# Patient Record
Sex: Female | Born: 1995 | Race: Black or African American | Hispanic: No | Marital: Single | Smoking: Never smoker
Health system: Southern US, Community
[De-identification: ages and names within clinical notes are randomized; demographics above are authoritative.]

## PROBLEM LIST (undated history)

## (undated) DIAGNOSIS — J45909 Unspecified asthma, uncomplicated: Secondary | ICD-10-CM

## (undated) DIAGNOSIS — M542 Cervicalgia: Secondary | ICD-10-CM

## (undated) DIAGNOSIS — R51 Headache: Secondary | ICD-10-CM

## (undated) DIAGNOSIS — M069 Rheumatoid arthritis, unspecified: Secondary | ICD-10-CM

## (undated) DIAGNOSIS — S060X9A Concussion with loss of consciousness of unspecified duration, initial encounter: Secondary | ICD-10-CM

## (undated) DIAGNOSIS — R519 Headache, unspecified: Secondary | ICD-10-CM

## (undated) DIAGNOSIS — N390 Urinary tract infection, site not specified: Secondary | ICD-10-CM

## (undated) DIAGNOSIS — M549 Dorsalgia, unspecified: Secondary | ICD-10-CM

## (undated) HISTORY — DX: Concussion with loss of consciousness of unspecified duration, initial encounter: S06.0X9A

## (undated) HISTORY — DX: Rheumatoid arthritis, unspecified: M06.9

## (undated) HISTORY — DX: Headache, unspecified: R51.9

## (undated) HISTORY — DX: Dorsalgia, unspecified: M54.9

## (undated) HISTORY — DX: Headache: R51

## (undated) HISTORY — DX: Cervicalgia: M54.2

## (undated) HISTORY — PX: BREAST BIOPSY: SHX20

---

## 2016-10-12 NOTE — Progress Notes (Signed)
Office Visit Note  Patient: Leah Wise             Date of Birth: 12-05-1995           MRN: [id number]             PCP: Kennyth Arnold, FNP Referring: Kennyth Arnold, FNP Visit Date: 10/17/2016 Occupation: '@GUAROCC'$ @    Subjective:  Pain of the Left Wrist; Pain of the Right Wrist; Pain of the Left Elbow; Pain of the Right Elbow; Pain of the Right Knee; Pain of the Left Knee; Pain of the Left Foot; Pain of the Right Foot; Pain of the Left Ankle; Pain of the Right Ankle; and New Patient (Initial Visit)   History of Present Illness: Leah Wise is a 21 y.o. female seen in consultation per request of her PCP. According to patient her symptoms started about 2 years ago with generalized arthralgias. She states initially started with the stiffness in knee joint popping and gradually the arthralgias, worse. And now she's been having increased pain in multiple joints which she describes as her elbows, wrist joints, hands, knee joints, ankle joints and feet. She reports hand swelling when exposed to the cold weather. She also notices that her hands turn red and she is exposed to the cold. She denies any neck or lower back pain. Patient complains of hives when exposed to the cold weather. She states the swelling usually happens when she is exposed to cold weather as well. He also reports some pictures on her eye phone today which shows clear swelling in her hands.  Activities of Daily Living:  Patient reports morning stiffness for 30 minutes.   Patient Reports nocturnal pain.  Difficulty dressing/grooming: Denies Difficulty climbing stairs: Reports Difficulty getting out of chair: Denies Difficulty using hands for taps, buttons, cutlery, and/or writing: Reports   Review of Systems  Constitutional: Positive for fatigue. Negative for night sweats, weight gain, weight loss and weakness.  HENT: Negative for mouth sores, trouble swallowing, trouble swallowing, mouth dryness and nose dryness.     Eyes: Negative for pain, redness, visual disturbance and dryness.  Respiratory: Negative for cough, shortness of breath and difficulty breathing.   Cardiovascular: Negative for chest pain, palpitations, hypertension, irregular heartbeat and swelling in legs/feet.  Gastrointestinal: Negative for blood in stool, constipation and diarrhea.  Endocrine: Negative for increased urination.  Genitourinary: Negative for vaginal dryness.  Musculoskeletal: Positive for arthralgias, joint pain, joint swelling, myalgias, morning stiffness and myalgias. Negative for muscle weakness and muscle tenderness.  Skin: Positive for color change. Negative for rash, hair loss, skin tightness, ulcers and sensitivity to sunlight.  Allergic/Immunologic: Negative for susceptible to infections.  Neurological: Negative for dizziness, memory loss and night sweats.  Hematological: Negative for swollen glands.  Psychiatric/Behavioral: Negative for depressed mood and sleep disturbance. The patient is not nervous/anxious.     PMFS History:  There are no active problems to display for this patient.   Past Medical History:  Diagnosis Date  . Asthma     No family history on file. History reviewed. No pertinent surgical history. Social History   Social History Narrative  . No narrative on file     Objective: Vital Signs: BP 106/66   Pulse 82   Resp 14   Ht '5\' 3"'$  (1.6 m)   Wt 118 lb (53.5 kg)   LMP 10/15/2016 (Exact Date)   BMI 20.90 kg/m    Physical Exam  Constitutional: She is oriented to person, place, and  time. She appears well-developed and well-nourished.  HENT:  Head: Normocephalic and atraumatic.  Eyes: Conjunctivae and EOM are normal.  Neck: Normal range of motion.  Cardiovascular: Normal rate, regular rhythm, normal heart sounds and intact distal pulses.   Pulmonary/Chest: Effort normal and breath sounds normal.  Abdominal: Soft. Bowel sounds are normal.  Lymphadenopathy:    She has no cervical  adenopathy.  Neurological: She is alert and oriented to person, place, and time.  Skin: Skin is warm and dry. Capillary refill takes less than 2 seconds.  Psychiatric: She has a normal mood and affect. Her behavior is normal.  Nursing note and vitals reviewed.    Musculoskeletal Exam: C-spine and thoracic lumbar spine good range of motion no SI joint tenderness. Shoulder joints although joints wrist joint MCPs PIPs DIPs with good range of motion with no synovitis. Hip joints knee joints ankles MTPs PIPs DIPs are good range of motion with no synovitis.  CDAI Exam: CDAI Homunculus Exam:   Joint Counts:  CDAI Tender Joint count: 0 CDAI Swollen Joint count: 0     Investigation: Findings:  01/22/2015 CBC WBC 11.880% granulocytes hemoglobin 12.5 platelets 240, CMP normal, ESR 12, TSH normal, rheumatoid factor less than 10, ANA negative    Imaging: Xr Foot 2 Views Left  Result Date: 10/17/2016 No MTP PIP/DIP's narrowing was noted. No calcaneal spur was noted. No erosive changes were noted. Impression: Normal x-ray of the foot  Xr Foot 2 Views Right  Result Date: 10/17/2016 No MTP PIP/DIP's narrowing was noted. No calcaneal spur was noted. No erosive changes were noted. Impression: Normal x-ray of the foot  Xr Hand 2 View Left  Result Date: 10/17/2016 The joint space narrowing noted in MCPs PIPs or intercarpal joints. No erosive changes were noted. Impression: Normal x-ray of hand  Xr Hand 2 View Right  Result Date: 10/17/2016 The joint space narrowing noted in MCPs PIPs or intercarpal joints. No erosive changes were noted. Impression: Normal x-ray of hand  Xr Knee 3 View Left  Result Date: 10/17/2016 No narrowing of medial or lateral compartment was noted. No patellofemoral narrowing was noted. No chondrocalcinosis was noted. Impression: Normal knee x-ray.  Xr Knee 3 View Right  Result Date: 10/17/2016 No narrowing of medial or lateral compartment was noted. No patellofemoral  narrowing was noted. No chondrocalcinosis was noted. Impression: Normal knee x-ray.   Speciality Comments: No specialty comments available.    Procedures:  No procedures performed Allergies: Patient has no known allergies.   Assessment / Plan:     Visit Diagnoses: Polyarthralgia -patient complains of frequent episodes of joint swelling especially when she is exposed to cold weather. She also got some pictures on eye phone showing swelling in her hands. She had no synovitis on examination today. Plan: CBC with Differential/Platelet, COMPLETE METABOLIC PANEL WITH GFR, Urinalysis, Routine w reflex microscopic, Sedimentation rate  Pain in both hands - Plan: XR Hand 2 View Right, XR Hand 2 View Left, x-rays were within normal limits today ANA, Rheumatoid factor, Cyclic citrul peptide antibody, IgG, 14-3-3 eta Protein. I will also schedule ultrasound of her bilateral hands to look for synovitis.  Chronic pain of both knees -she used to dance and had been dancing some is still. She complains of knee joint popping and discomfort. Plan: XR KNEE 3 VIEW RIGHT, XR KNEE 3 VIEW LEFT. Her x-rays were normal  Pain in both feet -she gives history of frequent problems with plantar fasciitis and intermittent feet swelling. Plan: XR Foot  2 Views Right, XR Foot 2 Views Left, her x-rays were normal today HLA-B27 antigen, Angiotensin converting enzyme  Hives -she complains of hives when exposed to cold weather. Plan: C1 Esterase Inhibitor  Myalgia - Plan: CK    Orders: Orders Placed This Encounter  Procedures  . XR Hand 2 View Right  . XR Hand 2 View Left  . XR Foot 2 Views Right  . XR Foot 2 Views Left  . XR KNEE 3 VIEW RIGHT  . XR KNEE 3 VIEW LEFT  . CBC with Differential/Platelet  . COMPLETE METABOLIC PANEL WITH GFR  . Urinalysis, Routine w reflex microscopic  . Sedimentation rate  . CK  . ANA  . Rheumatoid factor  . Cyclic citrul peptide antibody, IgG  . 14-3-3 eta Protein  . HLA-B27 antigen    . Angiotensin converting enzyme  . C1 Esterase Inhibitor   No orders of the defined types were placed in this encounter.   Face-to-face time spent with patient was 45 minutes. 50% of time was spent in counseling and coordination of care.  Follow-Up Instructions: Return for Polyarthralgia.   Bo Merino, MD  Note - This record has been created using Editor, commissioning.  Chart creation errors have been sought, but may not always  have been located. Such creation errors do not reflect on  the standard of medical care.

## 2016-10-15 ENCOUNTER — Emergency Department (HOSPITAL_COMMUNITY)
Admission: EM | Admit: 2016-10-15 | Discharge: 2016-10-15 | Disposition: A | Discharge status: Home / Self Care | Payer: BLUE CROSS/BLUE SHIELD | Attending: Emergency Medicine | Admitting: Emergency Medicine

## 2016-10-15 ENCOUNTER — Encounter (HOSPITAL_COMMUNITY): Payer: Self-pay

## 2016-10-15 DIAGNOSIS — T148XXA Other injury of unspecified body region, initial encounter: Secondary | ICD-10-CM

## 2016-10-15 DIAGNOSIS — J45909 Unspecified asthma, uncomplicated: Secondary | ICD-10-CM | POA: Insufficient documentation

## 2016-10-15 DIAGNOSIS — S39012A Strain of muscle, fascia and tendon of lower back, initial encounter: Secondary | ICD-10-CM | POA: Insufficient documentation

## 2016-10-15 DIAGNOSIS — Y999 Unspecified external cause status: Secondary | ICD-10-CM | POA: Diagnosis not present

## 2016-10-15 DIAGNOSIS — Y92410 Unspecified street and highway as the place of occurrence of the external cause: Secondary | ICD-10-CM | POA: Diagnosis not present

## 2016-10-15 DIAGNOSIS — Y939 Activity, unspecified: Secondary | ICD-10-CM | POA: Insufficient documentation

## 2016-10-15 DIAGNOSIS — S3992XA Unspecified injury of lower back, initial encounter: Secondary | ICD-10-CM | POA: Diagnosis present

## 2016-10-15 DIAGNOSIS — S060XAA Concussion with loss of consciousness status unknown, initial encounter: Secondary | ICD-10-CM

## 2016-10-15 DIAGNOSIS — S060X9A Concussion with loss of consciousness of unspecified duration, initial encounter: Secondary | ICD-10-CM

## 2016-10-15 HISTORY — DX: Concussion with loss of consciousness status unknown, initial encounter: S06.0XAA

## 2016-10-15 HISTORY — DX: Concussion with loss of consciousness of unspecified duration, initial encounter: S06.0X9A

## 2016-10-15 HISTORY — DX: Unspecified asthma, uncomplicated: J45.909

## 2016-10-15 MED ORDER — IBUPROFEN 400 MG PO TABS
600.0000 mg | ORAL_TABLET | Freq: Once | ORAL | Status: AC
  Administered 2016-10-15: 600 mg via ORAL
  Filled 2016-10-15: qty 1

## 2016-10-15 MED ORDER — CYCLOBENZAPRINE HCL 10 MG PO TABS
10.0000 mg | ORAL_TABLET | Freq: Once | ORAL | Status: AC
  Administered 2016-10-15: 10 mg via ORAL
  Filled 2016-10-15: qty 1

## 2016-10-15 MED ORDER — CYCLOBENZAPRINE HCL 10 MG PO TABS: 10.0000 mg | ORAL_TABLET | Freq: Two times a day (BID) | ORAL | 0 refills | Status: AC | PRN
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
## 2016-10-15 NOTE — Discharge Instructions (Signed)
You will be sore for the next few days.   Take Ibuprofen ever 6-8 hours as needed for pain.   Take the Flexeril as prescribed for muscle spasm. This will make you drowsy so do not take it while driving.   Please return to the ED if you have any worsening pain, numbness or weakness of your arms or legs, severe abdominal pain, nausea/vomiting.

## 2016-10-17 ENCOUNTER — Ambulatory Visit (INDEPENDENT_AMBULATORY_CARE_PROVIDER_SITE_OTHER): Payer: Self-pay

## 2016-10-17 ENCOUNTER — Encounter: Payer: Self-pay | Admitting: Rheumatology

## 2016-10-17 ENCOUNTER — Ambulatory Visit (INDEPENDENT_AMBULATORY_CARE_PROVIDER_SITE_OTHER): Payer: BLUE CROSS/BLUE SHIELD | Admitting: Rheumatology

## 2016-10-17 VITALS — BP 106/66 | HR 82 | Resp 14 | Ht 63.0 in | Wt 118.0 lb

## 2016-10-17 DIAGNOSIS — M791 Myalgia, unspecified site: Secondary | ICD-10-CM

## 2016-10-17 DIAGNOSIS — M79642 Pain in left hand: Secondary | ICD-10-CM

## 2016-10-17 DIAGNOSIS — M25561 Pain in right knee: Secondary | ICD-10-CM | POA: Diagnosis not present

## 2016-10-17 DIAGNOSIS — M79641 Pain in right hand: Secondary | ICD-10-CM | POA: Diagnosis not present

## 2016-10-17 DIAGNOSIS — L509 Urticaria, unspecified: Secondary | ICD-10-CM

## 2016-10-17 DIAGNOSIS — M79671 Pain in right foot: Secondary | ICD-10-CM

## 2016-10-17 DIAGNOSIS — G8929 Other chronic pain: Secondary | ICD-10-CM | POA: Diagnosis not present

## 2016-10-17 DIAGNOSIS — M79672 Pain in left foot: Secondary | ICD-10-CM

## 2016-10-17 DIAGNOSIS — M2550 Pain in unspecified joint: Secondary | ICD-10-CM | POA: Diagnosis not present

## 2016-10-17 DIAGNOSIS — M25562 Pain in left knee: Secondary | ICD-10-CM | POA: Diagnosis not present

## 2016-10-17 LAB — CBC WITH DIFFERENTIAL/PLATELET
Basophils Absolute: 0 cells/uL (ref 0–200)
Basophils Relative: 0 %
EOS ABS: 132 {cells}/uL (ref 15–500)
Eosinophils Relative: 3 %
HEMATOCRIT: 42.2 % (ref 35.0–45.0)
Hemoglobin: 14.2 g/dL (ref 11.7–15.5)
LYMPHS PCT: 44 %
Lymphs Abs: 1936 cells/uL (ref 850–3900)
MCH: 30.1 pg (ref 27.0–33.0)
MCHC: 33.6 g/dL (ref 32.0–36.0)
MCV: 89.6 fL (ref 80.0–100.0)
MONOS PCT: 12 %
MPV: 11.4 fL (ref 7.5–12.5)
Monocytes Absolute: 528 cells/uL (ref 200–950)
NEUTROS PCT: 41 %
Neutro Abs: 1804 cells/uL (ref 1500–7800)
PLATELETS: 219 10*3/uL (ref 140–400)
RBC: 4.71 MIL/uL (ref 3.80–5.10)
RDW: 12.4 % (ref 11.0–15.0)
WBC: 4.4 10*3/uL (ref 3.8–10.8)

## 2016-10-17 LAB — COMPLETE METABOLIC PANEL WITH GFR
ALT: 22 U/L (ref 6–29)
AST: 24 U/L (ref 10–30)
Albumin: 4.2 g/dL (ref 3.6–5.1)
Alkaline Phosphatase: 54 U/L (ref 33–115)
BUN: 9 mg/dL (ref 7–25)
CALCIUM: 9.2 mg/dL (ref 8.6–10.2)
CHLORIDE: 107 mmol/L (ref 98–110)
CO2: 23 mmol/L (ref 20–31)
Creat: 0.73 mg/dL (ref 0.50–1.10)
Glucose, Bld: 79 mg/dL (ref 65–99)
Potassium: 4.5 mmol/L (ref 3.5–5.3)
Sodium: 139 mmol/L (ref 135–146)
Total Bilirubin: 0.4 mg/dL (ref 0.2–1.2)
Total Protein: 7.6 g/dL (ref 6.1–8.1)

## 2016-10-18 LAB — URINALYSIS, ROUTINE W REFLEX MICROSCOPIC
Bilirubin Urine: NEGATIVE
Glucose, UA: NEGATIVE
KETONES UR: NEGATIVE
LEUKOCYTES UA: NEGATIVE
NITRITE: NEGATIVE
PH: 5.5 (ref 5.0–8.0)
SPECIFIC GRAVITY, URINE: 1.018 (ref 1.001–1.035)

## 2016-10-18 LAB — ANA: ANA: POSITIVE — AB

## 2016-10-18 LAB — URINALYSIS, MICROSCOPIC ONLY
Crystals: NONE SEEN [HPF]
Yeast: NONE SEEN [HPF]

## 2016-10-18 LAB — ANTI-NUCLEAR AB-TITER (ANA TITER): ANA Titer 1: 1:160 {titer} — ABNORMAL HIGH

## 2016-10-18 LAB — CK: Total CK: 38 U/L (ref 7–177)

## 2016-10-18 LAB — RHEUMATOID FACTOR: RHEUMATOID FACTOR: 141 [IU]/mL — AB (ref <14)

## 2016-10-18 LAB — CYCLIC CITRUL PEPTIDE ANTIBODY, IGG

## 2016-10-18 LAB — SEDIMENTATION RATE: SED RATE: 4 mm/h (ref 0–20)

## 2016-10-18 LAB — ANGIOTENSIN CONVERTING ENZYME: ANGIOTENSIN-CONVERTING ENZYME: 51 U/L (ref 9–67)

## 2016-10-19 ENCOUNTER — Emergency Department (HOSPITAL_COMMUNITY)
Admission: EM | Admit: 2016-10-19 | Discharge: 2016-10-19 | Disposition: A | Discharge status: Home / Self Care | Payer: BLUE CROSS/BLUE SHIELD | Attending: Emergency Medicine | Admitting: Emergency Medicine

## 2016-10-19 DIAGNOSIS — Y999 Unspecified external cause status: Secondary | ICD-10-CM | POA: Insufficient documentation

## 2016-10-19 DIAGNOSIS — R51 Headache: Secondary | ICD-10-CM | POA: Diagnosis not present

## 2016-10-19 DIAGNOSIS — Y939 Activity, unspecified: Secondary | ICD-10-CM | POA: Insufficient documentation

## 2016-10-19 DIAGNOSIS — F0781 Postconcussional syndrome: Secondary | ICD-10-CM | POA: Insufficient documentation

## 2016-10-19 DIAGNOSIS — J45909 Unspecified asthma, uncomplicated: Secondary | ICD-10-CM | POA: Diagnosis not present

## 2016-10-19 DIAGNOSIS — Y92410 Unspecified street and highway as the place of occurrence of the external cause: Secondary | ICD-10-CM | POA: Diagnosis not present

## 2016-10-19 MED ORDER — METHOCARBAMOL 500 MG PO TABS: 500.0000 mg | ORAL_TABLET | Freq: Three times a day (TID) | ORAL | 0 refills | Status: DC | PRN
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
## 2016-10-19 NOTE — Discharge Instructions (Signed)
Robaxin as needed for muscle aches and spasm. Concussion symptoms can take weeks to resolve.
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
## 2016-10-21 LAB — 14-3-3 ETA PROTEIN: 14-3-3 eta Protein: <0.2 ng/mL (ref <0.2)

## 2016-10-22 LAB — HLA-B27 ANTIGEN: DNA Result:: NEGATIVE

## 2016-10-24 NOTE — Progress Notes (Signed)
Labs c/w RA. If she is having lot of swelling please try to sch an earlier appt

## 2016-10-25 NOTE — Progress Notes (Signed)
Office Visit Note  Patient: Leah Wise             Date of Birth: [birth year]           MRN: [id number]             PCP: Kennyth Arnold, FNP Referring: Kennyth Arnold, FNP Visit Date: 10/26/2016 Occupation: '@GUAROCC'$ @    Subjective:  Pain hands   History of Present Illness: Leah Wise is a 21 y.o. female with history of polyarthralgias. She states she's been having frequent pain in her hands wrist and elbow joints. She has had problems with knee joints ankles and feet discomfort in the past which has not been very bothersome. She has noticed some swelling in her hands.  Activities of Daily Living:  Patient reports morning stiffness for 3 hours.   Patient Denies nocturnal pain.  Difficulty dressing/grooming: Reports Difficulty climbing stairs: Denies Difficulty getting out of chair: Denies Difficulty using hands for taps, buttons, cutlery, and/or writing: Reports   Review of Systems  Constitutional: Positive for fatigue. Negative for night sweats, weight gain, weight loss and weakness.  HENT: Negative for mouth sores, trouble swallowing, trouble swallowing, mouth dryness and nose dryness.   Eyes: Negative for pain, redness, visual disturbance and dryness.  Respiratory: Negative for cough, shortness of breath and difficulty breathing.   Cardiovascular: Negative for chest pain, palpitations, hypertension, irregular heartbeat and swelling in legs/feet.  Gastrointestinal: Negative for blood in stool, constipation and diarrhea.  Endocrine: Negative for increased urination.  Genitourinary: Negative for vaginal dryness.  Musculoskeletal: Positive for arthralgias, joint pain, joint swelling and morning stiffness. Negative for myalgias, muscle weakness, muscle tenderness and myalgias.  Skin: Negative for color change, rash, hair loss, skin tightness, ulcers and sensitivity to sunlight.  Allergic/Immunologic: Negative for susceptible to infections.  Neurological: Negative for  dizziness, memory loss and night sweats.  Hematological: Negative for swollen glands.  Psychiatric/Behavioral: Negative for depressed mood and sleep disturbance. The patient is not nervous/anxious.     PMFS History:  Patient Active Problem List   Diagnosis Date Noted  . Rheumatoid arthritis involving multiple sites with positive rheumatoid factor ([place]) 10/26/2016  . Pain in both hands 10/26/2016    Past Medical History:  Diagnosis Date  . Asthma   . Concussion 10/15/2016    History reviewed. No pertinent family history. History reviewed. No pertinent surgical history. Social History   Social History Narrative  . No narrative on file     Objective: Vital Signs: BP 114/82   Pulse 74   Resp 12   Ht '5\' 3"'$  (1.6 m)   Wt 117 lb (53.1 kg)   LMP 10/15/2016 (Exact Date)   BMI 20.73 kg/m    Physical Exam  Constitutional: She is oriented to person, place, and time. She appears well-developed and well-nourished.  HENT:  Head: Normocephalic and atraumatic.  Eyes: Conjunctivae and EOM are normal.  Neck: Normal range of motion.  Cardiovascular: Normal rate, regular rhythm, normal heart sounds and intact distal pulses.   Pulmonary/Chest: Effort normal and breath sounds normal.  Abdominal: Soft. Bowel sounds are normal.  Lymphadenopathy:    She has no cervical adenopathy.  Neurological: She is alert and oriented to person, place, and time.  Skin: Skin is warm and dry. Capillary refill takes less than 2 seconds.  Psychiatric: She has a normal mood and affect. Her behavior is normal.  Nursing note and vitals reviewed.    Musculoskeletal Exam: C-spine and thoracic lumbar spine good range  of motion. Shoulder joints elbow joints wrist joints are good range of motion. She had incomplete fist formation bilaterally. She synovitis over bilateral PIP joints as described below. Hip joints knee joints ankles MTPs PIPs are good range of motion with no synovitis.  CDAI Exam: CDAI Homunculus  Exam:   Tenderness:  Right hand: 1st PIP, 3rd PIP, 4th PIP and 5th PIP Left hand: 2nd PIP, 3rd PIP, 4th PIP and 5th PIP  Swelling:  Right hand: 3rd PIP, 4th PIP and 5th PIP Left hand: 2nd PIP  Joint Counts:  CDAI Tender Joint count: 8 CDAI Swollen Joint count: 4  Global Assessments:  Patient Global Assessment: 7 Provider Global Assessment: 7  CDAI Calculated Score: 26    Investigation: Findings:  10/24/2016 CBC normal, CMP normal, ESR 4, CK normal, UA trace protein, ace negative, HLA-B27 negative, ANA 1:160 homogeneous, RF 141, anti-CCP greater than 250, 14 33 eta negative  Office Visit on 10/17/2016  Component Date Value Ref Range Status  . WBC 10/17/2016 4.4  3.8 - 10.8 K/uL Final  . RBC 10/17/2016 4.71  3.80 - 5.10 MIL/uL Final  . Hemoglobin 10/17/2016 14.2  11.7 - 15.5 g/dL Final  . HCT 10/17/2016 42.2  35.0 - 45.0 % Final  . MCV 10/17/2016 89.6  80.0 - 100.0 fL Final  . MCH 10/17/2016 30.1  27.0 - 33.0 pg Final  . MCHC 10/17/2016 33.6  32.0 - 36.0 g/dL Final  . RDW 10/17/2016 12.4  11.0 - 15.0 % Final  . Platelets 10/17/2016 219  140 - 400 K/uL Final  . MPV 10/17/2016 11.4  7.5 - 12.5 fL Final  . Neutro Abs 10/17/2016 1804  1,500 - 7,800 cells/uL Final  . Lymphs Abs 10/17/2016 1936  850 - 3,900 cells/uL Final  . Monocytes Absolute 10/17/2016 528  200 - 950 cells/uL Final  . Eosinophils Absolute 10/17/2016 132  15 - 500 cells/uL Final  . Basophils Absolute 10/17/2016 0  0 - 200 cells/uL Final  . Neutrophils Relative % 10/17/2016 41  % Final  . Lymphocytes Relative 10/17/2016 44  % Final  . Monocytes Relative 10/17/2016 12  % Final  . Eosinophils Relative 10/17/2016 3  % Final  . Basophils Relative 10/17/2016 0  % Final  . Smear Review 10/17/2016 Criteria for review not met   Final  . Sodium 10/17/2016 139  135 - 146 mmol/L Final  . Potassium 10/17/2016 4.5  3.5 - 5.3 mmol/L Final  . Chloride 10/17/2016 107  98 - 110 mmol/L Final  . CO2 10/17/2016 23  20 - 31  mmol/L Final  . Glucose, Bld 10/17/2016 79  65 - 99 mg/dL Final  . BUN 10/17/2016 9  7 - 25 mg/dL Final  . Creat 10/17/2016 0.73  0.50 - 1.10 mg/dL Final  . Total Bilirubin 10/17/2016 0.4  0.2 - 1.2 mg/dL Final  . Alkaline Phosphatase 10/17/2016 54  33 - 115 U/L Final  . AST 10/17/2016 24  10 - 30 U/L Final  . ALT 10/17/2016 22  6 - 29 U/L Final  . Total Protein 10/17/2016 7.6  6.1 - 8.1 g/dL Final  . Albumin 10/17/2016 4.2  3.6 - 5.1 g/dL Final  . Calcium 10/17/2016 9.2  8.6 - 10.2 mg/dL Final  . GFR, Est African American 10/17/2016 >89  >=60 mL/min Final  . GFR, Est Non African American 10/17/2016 >89  >=60 mL/min Final  . Color, Urine 10/17/2016 YELLOW  YELLOW Final  . APPearance 10/17/2016 CLEAR  CLEAR Final  .  Specific Gravity, Urine 10/17/2016 1.018  1.001 - 1.035 Final  . pH 10/17/2016 5.5  5.0 - 8.0 Final  . Glucose, UA 10/17/2016 NEGATIVE  NEGATIVE Final  . Bilirubin Urine 10/17/2016 NEGATIVE  NEGATIVE Final  . Ketones, ur 10/17/2016 NEGATIVE  NEGATIVE Final  . Hgb urine dipstick 10/17/2016 3+* NEGATIVE Final  . Protein, ur 10/17/2016 TRACE* NEGATIVE Final  . Nitrite 10/17/2016 NEGATIVE  NEGATIVE Final  . Leukocytes, UA 10/17/2016 NEGATIVE  NEGATIVE Final  . Sed Rate 10/17/2016 4  0 - 20 mm/hr Final  . Total CK 10/17/2016 38  7 - 177 U/L Final  . Anit Nuclear Antibody(ANA) 10/17/2016 POS* NEGATIVE Final  . Rhuematoid fact SerPl-aCnc 10/17/2016 141* <14 IU/mL Final  . Cyclic Citrullin Peptide Ab 10/17/2016 >250* Units Final   Comment:   Reference Range Negative               < 20 Weak Positive            20 - 39 Moderate Positive        40 - 59 Strong Positive        > 59   . 14-3-3 eta Protein 10/17/2016 <0.2  <0.2 ng/mL Final   Comment:   This test was developed and its analytical performance characteristics have been determined by [facility]. It has not been cleared or approved by FDA. This assay has been validated  pursuant to the CLIA regulations and is used for clinical purposes.   . DNA Result: 10/17/2016 Negative  Negative Final  . Results reviewed by: 10/17/2016 REPORT   Final   Comment: Ileene Hutchinson, Ph.D.,FAC[facility] Senior Director, Molecular Genetics The B27 allele group of the HLA-B locus is present in 2 to 9% of the general population.  About 20% of HLA-B27 carriers develop autoimmune disorders including Ankylosing Spondylitis (AS), Reactive Arthritis, Psoriatic Arthritis, Undifferentiated Oligoarthritis, Uveitis, or Inflammatory Bowel Disease. The highest association is with AS, where approximately 95% of AS patients are HLA-B27 positive. Genetic counseling as needed. Typing performed by PCR and hybridization with sequence specific oligonucleotide probes (SSO) using the FDA-cleared LABType(R) SSO Kit.   . Angiotensin-Converting Enzyme 10/17/2016 51  9 - 67 U/L Final   Comment: ** Please note change in reference range(s). **     . ANA Pattern 1 10/17/2016 HOMOGENEOUS*  Final  . ANA Titer 1 10/17/2016 1:160* titer Final   Comment:           Reference Range           < 1:40      Negative             1:40-1:80 Low Antibody level           > 1:80      Elevated Antibody level   . WBC, UA 10/17/2016 0-5  <=5 WBC/HPF Final  . RBC / HPF 10/17/2016 0-2  <=2 RBC/HPF Final  . Squamous Epithelial / LPF 10/17/2016 6-10* <=5 HPF Final  . Bacteria, UA 10/17/2016 MODERATE* NONE SEEN HPF Final  . Crystals 10/17/2016 NONE SEEN  NONE SEEN HPF Final  . Casts 10/17/2016 See Below* NONE SEEN LPF Final  . Yeast 10/17/2016 NONE SEEN  NONE SEEN HPF Final    Imaging: Xr Foot 2 Views Left  Result Date: 10/17/2016 No MTP PIP/DIP's narrowing was noted. No calcaneal spur was noted. No erosive changes were noted. Impression: Normal x-ray of the foot  Xr Foot 2 Views Right  Result Date: 10/17/2016 No MTP PIP/DIP's narrowing was noted. No calcaneal spur was noted. No erosive changes were noted.  Impression: Normal x-ray of the foot  Xr Hand 2 View Left  Result Date: 10/17/2016 The joint space narrowing noted in MCPs PIPs or intercarpal joints. No erosive changes were noted. Impression: Normal x-ray of hand  Xr Hand 2 View Right  Result Date: 10/17/2016 The joint space narrowing noted in MCPs PIPs or intercarpal joints. No erosive changes were noted. Impression: Normal x-ray of hand  Xr Knee 3 View Left  Result Date: 10/17/2016 No narrowing of medial or lateral compartment was noted. No patellofemoral narrowing was noted. No chondrocalcinosis was noted. Impression: Normal knee x-ray.  Xr Knee 3 View Right  Result Date: 10/17/2016 No narrowing of medial or lateral compartment was noted. No patellofemoral narrowing was noted. No chondrocalcinosis was noted. Impression: Normal knee x-ray.   Speciality Comments: No specialty comments available.    Procedures:  No procedures performed Allergies: Patient has no known allergies.   Assessment / Plan:     Visit Diagnoses: Rheumatoid arthritis involving multiple sites with positive rheumatoid factor (HCC) - RF 141, anti-CCP> 250, ANA 1:160 homogeneous,positive synovitisIn her hands today. Detailed counseling regarding rheumatoid arthritis was provided. Different treatment options and side effects were discussed at length. I would use prednisone as a bridging therapy the plan is to start her on prednisone 5 mg tablets. She will take 20 mg for one week, 15 mg for one week, 10 mg for one week 5 mg for one week and then 2.5 mg by mouth every week. I will initiate Plaquenil 200 mg by mouth twice a day Monday to Friday. Informed consent was taken today. She will also need eye exam. We'll advised pneumococcal vaccine. I would like to see response over the next 2 months if she has inadequate response he may proceed with methotrexate. Patient states that she is using oral contraceptive pills.  Pain in both hands: Positive synovitis on  examination  Myalgia: She continues to have some myalgias but her labs were unremarkable.  Rash : Patient reports intermittent rash only when exposed to cold weather. No rash was noted in the office today.   Orders: Orders Placed This Encounter  Procedures  . CBC with Differential/Platelet  . COMPLETE METABOLIC PANEL WITH GFR   Meds ordered this encounter  Medications  . predniSONE (DELTASONE) 5 MG tablet    Sig: Take 20 mg PO QD x 7 days, then 15 mg PO QD x7 days, then 10 mg PO QD x7 days, then 5 mg PO QD x7 days then 2.5 mg PO QD x 7 days    Dispense:  74 tablet    Refill:  0  . hydroxychloroquine (PLAQUENIL) 200 MG tablet    Sig: Take 1 tablet (200 mg total) by mouth 2 (two) times daily. Monday through Friday    Dispense:  40 tablet    Refill:  2    Face-to-face time spent with patient was 35 minutes. 50% of time was spent in counseling and coordination of care.  Follow-Up Instructions: Return in about 2 months (around 12/26/2016) for Rheumatoid arthritis.   Bo Merino, MD  Note - This record has been created using Editor, commissioning.  Chart creation errors have been sought, but may not always  have been located. Such creation errors do not reflect on  the standard of medical care.

## 2016-10-26 ENCOUNTER — Ambulatory Visit (INDEPENDENT_AMBULATORY_CARE_PROVIDER_SITE_OTHER): Payer: BLUE CROSS/BLUE SHIELD | Admitting: Rheumatology

## 2016-10-26 ENCOUNTER — Encounter: Payer: Self-pay | Admitting: Rheumatology

## 2016-10-26 VITALS — BP 114/82 | HR 74 | Resp 12 | Ht 63.0 in | Wt 117.0 lb

## 2016-10-26 DIAGNOSIS — Z79899 Other long term (current) drug therapy: Secondary | ICD-10-CM | POA: Diagnosis not present

## 2016-10-26 DIAGNOSIS — M79641 Pain in right hand: Secondary | ICD-10-CM | POA: Diagnosis not present

## 2016-10-26 DIAGNOSIS — M79642 Pain in left hand: Secondary | ICD-10-CM | POA: Diagnosis not present

## 2016-10-26 DIAGNOSIS — R21 Rash and other nonspecific skin eruption: Secondary | ICD-10-CM | POA: Diagnosis not present

## 2016-10-26 DIAGNOSIS — M791 Myalgia, unspecified site: Secondary | ICD-10-CM

## 2016-10-26 DIAGNOSIS — M0579 Rheumatoid arthritis with rheumatoid factor of multiple sites without organ or systems involvement: Secondary | ICD-10-CM | POA: Diagnosis not present

## 2016-10-26 MED ORDER — PREDNISONE 5 MG PO TABS
ORAL_TABLET | ORAL | 0 refills | Status: DC
Start: 2016-10-26 — End: 2016-12-28

## 2016-10-26 MED ORDER — HYDROXYCHLOROQUINE SULFATE 200 MG PO TABS: 200.0000 mg | ORAL_TABLET | Freq: Two times a day (BID) | ORAL | 2 refills | Status: DC

## 2016-10-26 NOTE — Patient Instructions (Addendum)
Please talk to your primary care provider about getting a pneumonia and a flu shot  Standing Labs We placed an order today for your standing lab work.    Please come back and get your standing labs in 1 month then in 3 months.   We have open lab Monday through Friday from 8:30-11:30 AM and 1:30-4 PM at the office of Dr. Tresa Moore, PA.   The office is located at [address] [place], [place], [place] [address] No appointment is necessary.   Labs are drawn by [facility].  You may receive a bill from [facility] for your lab work.     Rheumatoid Arthritis Rheumatoid arthritis (RA) is a long-term (chronic) disease. RA causes inflammation in your joints. Your joints may feel painful, stiff, swollen, warm, or tender. RA may start slowly. Usually, it affects the small joints of the hands and feet. It can also affect other parts of the body, even the heart, eyes, or lungs. Symptoms of RA often come and go. Sometimes, symptoms get worse for a while. These are called flares. There is no cure for RA, but your doctor will work with you to find the best treatment option for you. This will depend on how the disease is changing in your body. Follow these instructions at home:  Take over-the-counter and prescription medicines only as told by your doctor. Your doctor may change (adjust) your medicines every 3 months.  Start an exercise program as told by your doctor.  Rest when you have a flare.  Return to your normal activities as told by your doctor. Ask your doctor what activities are safe for you.  Keep all follow-up visits as told by your doctor. This is important. Contact a doctor if:  You have a flare.  You have a fever.  You have problems (side effects) because of your medicines. Get help right away if:  You have chest pain.  You have trouble breathing.  You have a hot, painful joint all of a sudden, and it is worse than your usual joint aches. This information is  not intended to replace advice given to you by your health care provider. Make sure you discuss any questions you have with your health care provider. Document Released: 10/10/2011 Document Revised: 12/24/2015 Document Reviewed: 04/30/2015 Elsevier Interactive Patient Education  2017 [employer].   Hydroxychloroquine tablets What is this medicine? HYDROXYCHLOROQUINE (hye drox ee KLOR oh kwin) is used to treat rheumatoid arthritis and systemic lupus erythematosus. It is also used to treat malaria. This medicine may be used for other purposes; ask your health care provider or pharmacist if you have questions. COMMON BRAND NAME(S): Plaquenil, Quineprox What should I tell my health care provider before I take this medicine? They need to know if you have any of these conditions: -diabetes -eye disease, vision problems -G6PD deficiency -history of blood diseases -history of irregular heartbeat -if you often drink alcohol -kidney disease -liver disease -porphyria -psoriasis -seizures -an unusual or allergic reaction to chloroquine, hydroxychloroquine, other medicines, foods, dyes, or preservatives -pregnant or trying to get pregnant -breast-feeding How should I use this medicine? Take this medicine by mouth with a glass of water. Follow the directions on the prescription label. Avoid taking antacids within 4 hours of taking this medicine. It is best to separate these medicines by at least 4 hours. Do not cut, crush or chew this medicine. You can take it with or without food. If it upsets your stomach, take it with food.  Take your medicine at regular intervals. Do not take your medicine more often than directed. Take all of your medicine as directed even if you think you are better. Do not skip doses or stop your medicine early. Talk to your pediatrician regarding the use of this medicine in children. While this drug may be prescribed for selected conditions, precautions do apply. Overdosage:  If you think you have taken too much of this medicine contact a poison control center or emergency room at once. NOTE: This medicine is only for you. Do not share this medicine with others. What if I miss a dose? If you miss a dose, take it as soon as you can. If it is almost time for your next dose, take only that dose. Do not take double or extra doses. What may interact with this medicine? Do not take this medicine with any of the following medications: -cisapride -dofetilide -dronedarone -live virus vaccines -penicillamine -pimozide -thioridazine -ziprasidone This medicine may also interact with the following medications: -ampicillin -antacids -cimetidine -cyclosporine -digoxin -medicines for diabetes, like insulin, glipizide, glyburide -medicines for seizures like carbamazepine, phenobarbital, phenytoin -mefloquine -methotrexate -other medicines that prolong the QT interval (cause an abnormal heart rhythm) -praziquantel This list may not describe all possible interactions. Give your health care provider a list of all the medicines, herbs, non-prescription drugs, or dietary supplements you use. Also tell them if you smoke, drink alcohol, or use illegal drugs. Some items may interact with your medicine. What should I watch for while using this medicine? Tell your doctor or healthcare professional if your symptoms do not start to get better or if they get worse. Avoid taking antacids within 4 hours of taking this medicine. It is best to separate these medicines by at least 4 hours. Tell your doctor or health care professional right away if you have any change in your eyesight. Your vision and blood may be tested before and during use of this medicine. This medicine can make you more sensitive to the sun. Keep out of the sun. If you cannot avoid being in the sun, wear protective clothing and use sunscreen. Do not use sun lamps or tanning beds/booths. What side effects may I notice  from receiving this medicine? Side effects that you should report to your doctor or health care professional as soon as possible: -allergic reactions like skin rash, itching or hives, swelling of the face, lips, or tongue -changes in vision -decreased hearing or ringing of the ears -redness, blistering, peeling or loosening of the skin, including inside the mouth -seizures -sensitivity to light -signs and symptoms of a dangerous change in heartbeat or heart rhythm like chest pain; dizziness; fast or irregular heartbeat; palpitations; feeling faint or lightheaded, falls; breathing problems -signs and symptoms of liver injury like dark yellow or brown urine; general ill feeling or flu-like symptoms; light-colored stools; loss of appetite; nausea; right upper belly pain; unusually weak or tired; yellowing of the eyes or skin -signs and symptoms of low blood sugar such as feeling anxious; confusion; dizziness; increased hunger; unusually weak or tired; sweating; shakiness; cold; irritable; headache; blurred vision; fast heartbeat; loss of consciousness -uncontrollable head, mouth, neck, arm, or leg movements Side effects that usually do not require medical attention (report to your doctor or health care professional if they continue or are bothersome): -anxious -diarrhea -dizziness -hair loss -headache -irritable -loss of appetite -nausea, vomiting -stomach pain This list may not describe all possible side effects. Call your doctor for medical advice about side  effects. You may report side effects to FDA at 1-800-FDA-1088. Where should I keep my medicine? Keep out of the reach of children. In children, this medicine can cause overdose with small doses. Store at room temperature between 15 and 30 degrees C (59 and 86 degrees F). Protect from moisture and light. Throw away any unused medicine after the expiration date. NOTE: This sheet is a summary. It may not cover all possible information. If  you have questions about this medicine, talk to your doctor, pharmacist, or health care provider.  2018 Elsevier/Gold Standard (2016-03-02 14:16:15)   .

## 2016-10-26 NOTE — Progress Notes (Signed)
Pharmacy Note  Subjective: Patient presents today to the [facility] to see Dr. Estanislado Pandy.  Patient seen by the pharmacist for counseling on hydroxychloroquine.    Objective: CMP Latest Ref Rng & Units 10/17/2016  Glucose 65 - 99 mg/dL 79  BUN 7 - 25 mg/dL 9  Creatinine 0.50 - 1.10 mg/dL 0.73  Sodium 135 - 146 mmol/L 139  Potassium 3.5 - 5.3 mmol/L 4.5  Chloride 98 - 110 mmol/L 107  CO2 20 - 31 mmol/L 23  Calcium 8.6 - 10.2 mg/dL 9.2  Total Protein 6.1 - 8.1 g/dL 7.6  Total Bilirubin 0.2 - 1.2 mg/dL 0.4  Alkaline Phos 33 - 115 U/L 54  AST 10 - 30 U/L 24  ALT 6 - 29 U/L 22   CBC    Component Value Date/Time   WBC 4.4 10/17/2016 0904   RBC 4.71 10/17/2016 0904   HGB 14.2 10/17/2016 0904   HCT 42.2 10/17/2016 0904   PLT 219 10/17/2016 0904   MCV 89.6 10/17/2016 0904   MCH 30.1 10/17/2016 0904   MCHC 33.6 10/17/2016 0904   RDW 12.4 10/17/2016 0904   LYMPHSABS 1,936 10/17/2016 0904   MONOABS 528 10/17/2016 0904   EOSABS 132 10/17/2016 0904   BASOSABS 0 10/17/2016 0904   Assessment/Plan: Patient was prescribed hydroxychloroquine 200 mg BID Monday through Friday.  Patient was counseled on the purpose, proper use, and adverse effects of hydroxychloroquine including nausea/diarrhea, skin rash, headaches, and sun sensitivity.  Discussed importance of annual eye exams while on hydroxychloroquine to monitor to ocular toxicity and discussed importance of frequent laboratory monitoring.  Provided patient with eye exam form for baseline ophthalmologic exam and standing lab instructions.  Provided patient with educational materials on hydroxychloroquine and answered all questions.  Patient consented to hydroxychloroquine.  Will upload consent in the media tab.    Elisabeth Most, Pharm.D., BCPS Clinical Pharmacist Pager: [phone] Phone: [phone] 10/26/2016 4:15 PM

## 2016-11-23 ENCOUNTER — Ambulatory Visit: Payer: Self-pay | Admitting: Rheumatology

## 2016-12-01 NOTE — Progress Notes (Deleted)
Visit Diagnoses: Rheumatoid arthritis involving multiple sites with positive rheumatoid factor (HCC) - RF 141, anti-CCP> 250, ANA 1:160 homogeneous,positive synovitisIn her hands today. Detailed counseling regarding rheumatoid arthritis was provided. Different treatment options and side effects were discussed at length. I would use prednisone as a bridging therapy the plan is to start her on prednisone 5 mg tablets. She will take 20 mg for one week, 15 mg for one week, 10 mg for one week 5 mg for one week and then 2.5 mg by mouth every week. I will initiate Plaquenil 200 mg by mouth twice a day Monday to Friday. Informed consent was taken today. She will also need eye exam. We'll advised pneumococcal vaccine. I would like to see response over the next 2 months if she has inadequate response he may proceed with methotrexate. Patient states that she is using oral contraceptive pills.  Pain in both hands: Positive synovitis on examination  Myalgia: She continues to have some myalgias but her labs were unremarkable.  Rash : Patient reports intermittent rash only when exposed to cold weather. No rash was noted in the office today.

## 2016-12-07 ENCOUNTER — Ambulatory Visit: Payer: BLUE CROSS/BLUE SHIELD | Admitting: Rheumatology

## 2016-12-16 DIAGNOSIS — Z79899 Other long term (current) drug therapy: Secondary | ICD-10-CM | POA: Insufficient documentation

## 2016-12-16 DIAGNOSIS — M791 Myalgia, unspecified site: Secondary | ICD-10-CM | POA: Insufficient documentation

## 2016-12-16 DIAGNOSIS — R21 Rash and other nonspecific skin eruption: Secondary | ICD-10-CM | POA: Insufficient documentation

## 2016-12-16 NOTE — Progress Notes (Signed)
Office Visit Note  Patient: Leah Wise             Date of Birth: 06-19-1996           MRN: [id number]             PCP: Kennyth Arnold, FNP Referring: Kennyth Arnold, FNP Visit Date: 12/28/2016 Occupation: @GUAROCC @    Subjective:  Medication Management   History of Present Illness: Leah Wise is a 21 y.o. female with sero positive rheumatoid arthritis. She's been on Plaquenil since 10/26/2016. She states she's been doing quite well without any joint pain or discomfort. She ran out of Plaquenil and now in May and had a flare. With the flares she had pain and swelling in her hands and feet numbness in her toes. At the time she was given a prednisone taper and restarted on Plaquenil.  She is doing much better now.  Activities of Daily Living:  Patient reports morning stiffness for 0 minute.   Patient Denies nocturnal pain.  Difficulty dressing/grooming: Denies Difficulty climbing stairs: Denies Difficulty getting out of chair: Denies Difficulty using hands for taps, buttons, cutlery, and/or writing: Denies   Review of Systems  Constitutional: Negative for fatigue, night sweats, weight gain, weight loss and weakness.  HENT: Negative for mouth sores, trouble swallowing, trouble swallowing, mouth dryness and nose dryness.   Eyes: Negative for pain, redness, visual disturbance and dryness.  Respiratory: Negative for cough, shortness of breath and difficulty breathing.   Cardiovascular: Negative for chest pain, palpitations, hypertension, irregular heartbeat and swelling in legs/feet.  Gastrointestinal: Negative for blood in stool, constipation and diarrhea.  Endocrine: Negative for increased urination.  Genitourinary: Negative for vaginal dryness.  Musculoskeletal: Negative for arthralgias, joint pain, joint swelling, myalgias, muscle weakness, morning stiffness, muscle tenderness and myalgias.  Skin: Negative for color change, rash, hair loss, skin tightness, ulcers and  sensitivity to sunlight.  Allergic/Immunologic: Negative for susceptible to infections.  Neurological: Negative for dizziness, memory loss and night sweats.  Hematological: Negative for swollen glands.  Psychiatric/Behavioral: Negative for depressed mood and sleep disturbance. The patient is not nervous/anxious.     PMFS History:  Patient Active Problem List   Diagnosis Date Noted  . Myalgia 12/16/2016  . Rash and other nonspecific skin eruption 12/16/2016  . High risk medication use 12/16/2016  . Rheumatoid arthritis involving multiple sites with positive rheumatoid factor ([place]) 10/26/2016  . Pain in both hands 10/26/2016    Past Medical History:  Diagnosis Date  . Asthma   . Concussion 10/15/2016    History reviewed. No pertinent family history. History reviewed. No pertinent surgical history. Social History   Social History Narrative  . No narrative on file     Objective: Vital Signs: BP (!) 108/58   Pulse 66   Resp 14   Wt 112 lb (50.8 kg)   LMP 12/08/2016 (Exact Date)   BMI 19.84 kg/m    Physical Exam  Constitutional: She is oriented to person, place, and time. She appears well-developed and well-nourished.  HENT:  Head: Normocephalic and atraumatic.  Eyes: Conjunctivae and EOM are normal.  Neck: Normal range of motion.  Cardiovascular: Normal rate, regular rhythm, normal heart sounds and intact distal pulses.   Pulmonary/Chest: Effort normal and breath sounds normal.  Abdominal: Soft. Bowel sounds are normal.  Lymphadenopathy:    She has no cervical adenopathy.  Neurological: She is alert and oriented to person, place, and time.  Skin: Skin is warm and dry. Capillary  refill takes less than 2 seconds.  Psychiatric: She has a normal mood and affect. Her behavior is normal.  Nursing note and vitals reviewed.    Musculoskeletal Exam: C-spine and thoracic lumbar spine good range of motion. Shoulder joints elbow joints wrist joint MCPs PIPs DIPs with good range  of motion. Hip joints knee joints ankles MTPs PIPs DIPs are good range of motion. No synovitis was noted on exam today.  CDAI Exam: CDAI Homunculus Exam:   Joint Counts:  CDAI Tender Joint count: 0 CDAI Swollen Joint count: 0  Global Assessments:  Patient Global Assessment: 1 Provider Global Assessment: 1  CDAI Calculated Score: 2    Investigation: No additional findings.   Imaging: No results found.  Speciality Comments: No specialty comments available.    Procedures:  No procedures performed Allergies: Patient has no known allergies.   Assessment / Plan:     Visit Diagnoses: Rheumatoid arthritis involving multiple sites with positive rheumatoid factor (HCC) - Positive RF, positive anti-CCP, positive ANA, positive synovitis initially. She's been on Plaquenil for 2 months now. She had a flare once she ran out of Plaquenil. She was given a prednisone taper and responded really well. Today she has no synovitis on examination.  High risk medication use - Hydroxychloroquine 200 mg by mouth twice a day Mon - Fri (10/26/2016). Her labs are past-due check her labs today and then every 3 months to monitor for drug toxicity.  Pain in both hands: Resolved  History of rash - When exposed to cold temperature  Myalgia    Orders: No orders of the defined types were placed in this encounter.  No orders of the defined types were placed in this encounter.   Face-to-face time spent with patient was 25 minutes. 50% of time was spent in counseling and coordination of care.  Follow-Up Instructions: Return in about 3 months (around 03/30/2017) for Rheumatoid arthritis.   Bo Merino, MD  Note - This record has been created using Editor, commissioning.  Chart creation errors have been sought, but may not always  have been located. Such creation errors do not reflect on  the standard of medical care.

## 2016-12-21 ENCOUNTER — Telehealth: Payer: Self-pay | Admitting: Rheumatology

## 2016-12-21 NOTE — Telephone Encounter (Signed)
She should refill her PLQ. Ok to give Prednisone taper 5mg  tab  4tabs po x4d, 3x4d, 2x4d, 1x4d, 1/2x4d

## 2016-12-21 NOTE — Telephone Encounter (Signed)
Patient states she is having a flare. Patient states she has been having pain in ankles, fingers and wrist. Patient states she is also having numbness in her toes. Patient states she has noticed some swelling in her fingers. Patient state this has been going on for approximately 1 week. Patient states she has not been taking her PLQ for the last month

## 2016-12-21 NOTE — Telephone Encounter (Signed)
Patient is having a flare. Next scheduled appt is 5/30. Patient stopped meds doctor put her on about two weeks ago. Patient was wondering if she needs to restart meds, and at what dose/. Please advise.

## 2016-12-22 MED ORDER — HYDROXYCHLOROQUINE SULFATE 200 MG PO TABS: 200.0000 mg | ORAL_TABLET | Freq: Two times a day (BID) | ORAL | 2 refills | Status: DC

## 2016-12-22 MED ORDER — PREDNISONE 5 MG PO TABS: ORAL_TABLET | ORAL | 0 refills | Status: DC

## 2016-12-22 NOTE — Telephone Encounter (Signed)
Patient advised prescription for prednisone being sent to the pharmacy. Patient also requested to have refill sent to the pharmacy for PLQ as she is not on campus and that is where she usually fills her PLQ. Per Dr.Deveshwar okay to send the prescription for PLQ.

## 2016-12-28 ENCOUNTER — Other Ambulatory Visit: Payer: Self-pay | Admitting: Rheumatology

## 2016-12-28 ENCOUNTER — Encounter: Payer: Self-pay | Admitting: Rheumatology

## 2016-12-28 ENCOUNTER — Other Ambulatory Visit: Payer: Self-pay

## 2016-12-28 ENCOUNTER — Ambulatory Visit (INDEPENDENT_AMBULATORY_CARE_PROVIDER_SITE_OTHER): Payer: BLUE CROSS/BLUE SHIELD | Admitting: Rheumatology

## 2016-12-28 VITALS — BP 108/58 | HR 66 | Resp 14 | Wt 112.0 lb

## 2016-12-28 DIAGNOSIS — M79641 Pain in right hand: Secondary | ICD-10-CM | POA: Diagnosis not present

## 2016-12-28 DIAGNOSIS — M0579 Rheumatoid arthritis with rheumatoid factor of multiple sites without organ or systems involvement: Secondary | ICD-10-CM | POA: Diagnosis not present

## 2016-12-28 DIAGNOSIS — M791 Myalgia, unspecified site: Secondary | ICD-10-CM

## 2016-12-28 DIAGNOSIS — M79642 Pain in left hand: Secondary | ICD-10-CM

## 2016-12-28 DIAGNOSIS — Z79899 Other long term (current) drug therapy: Secondary | ICD-10-CM

## 2016-12-28 DIAGNOSIS — R21 Rash and other nonspecific skin eruption: Secondary | ICD-10-CM | POA: Diagnosis not present

## 2016-12-28 LAB — CBC WITH DIFFERENTIAL/PLATELET
BASOS PCT: 0 %
Basophils Absolute: 0 cells/uL (ref 0–200)
Eosinophils Absolute: 146 cells/uL (ref 15–500)
Eosinophils Relative: 1 %
HEMATOCRIT: 38.6 % (ref 35.0–45.0)
Hemoglobin: 13.2 g/dL (ref 11.7–15.5)
LYMPHS PCT: 17 %
Lymphs Abs: 2482 cells/uL (ref 850–3900)
MCH: 30.4 pg (ref 27.0–33.0)
MCHC: 34.2 g/dL (ref 32.0–36.0)
MCV: 88.9 fL (ref 80.0–100.0)
MONO ABS: 876 {cells}/uL (ref 200–950)
MONOS PCT: 6 %
MPV: 10.8 fL (ref 7.5–12.5)
NEUTROS PCT: 76 %
Neutro Abs: 11096 cells/uL — ABNORMAL HIGH (ref 1500–7800)
PLATELETS: 259 10*3/uL (ref 140–400)
RBC: 4.34 MIL/uL (ref 3.80–5.10)
RDW: 12.6 % (ref 11.0–15.0)
WBC: 14.6 10*3/uL — AB (ref 3.8–10.8)

## 2016-12-28 LAB — COMPLETE METABOLIC PANEL WITH GFR
ALT: 13 U/L (ref 6–29)
AST: 13 U/L (ref 10–30)
Albumin: 3.7 g/dL (ref 3.6–5.1)
Alkaline Phosphatase: 30 U/L — ABNORMAL LOW (ref 33–115)
BUN: 9 mg/dL (ref 7–25)
CALCIUM: 9 mg/dL (ref 8.6–10.2)
CHLORIDE: 105 mmol/L (ref 98–110)
CO2: 23 mmol/L (ref 20–31)
Creat: 0.87 mg/dL (ref 0.50–1.10)
GFR, Est African American: >89 mL/min (ref >=60)
GFR, Est Non African American: >89 mL/min (ref >=60)
Glucose, Bld: 68 mg/dL (ref 65–99)
POTASSIUM: 4 mmol/L (ref 3.5–5.3)
Sodium: 139 mmol/L (ref 135–146)
Total Bilirubin: 0.6 mg/dL (ref 0.2–1.2)
Total Protein: 6.6 g/dL (ref 6.1–8.1)

## 2016-12-28 NOTE — Patient Instructions (Addendum)
Standing Labs We placed an order today for your standing lab work.    Please come back and get your standing labs in September and every 3 months  We have open lab Monday through Friday from 8:30-11:30 AM and 1:30-4 PM at the office of Dr. Tresa Moore, PA.   The office is located at [address], [place], [place],  [address] No appointment is necessary.   Labs are drawn by [facility].  You may receive a bill from [facility] for your lab work. If you have any questions regarding directions or hours of operation,  please call [phone].     Dr Katy Fitch office may be able to see you for this exam his number is [phone]

## 2016-12-29 NOTE — Progress Notes (Signed)
Add phosphorus level if possible. WBC high due to steroid use.

## 2017-01-04 LAB — PHOSPHORUS: PHOSPHORUS: 3.5 mg/dL (ref 2.5–4.5)

## 2017-01-04 NOTE — Progress Notes (Signed)
WNL

## 2017-02-14 NOTE — Progress Notes (Signed)
Assessment / Plan:     Visit Diagnoses: Rheumatoid arthritis involving multiple sites with positive rheumatoid factor (HCC) - Positive RF, positive anti-CCP, positive ANA, positive synovitis initially. She's been on Plaquenil for 2 months now. She had a flare once she ran out of Plaquenil. She was given a prednisone taper and responded really well. Today she has no synovitis on examination.  High risk medication use - Hydroxychloroquine 200 mg by mouth twice a day Mon - Fri (10/26/2016). Her labs are past-due check her labs today and then every 3 months to monitor for drug toxicity.  Pain in both hands: Resolved  History of rash - When exposed to cold temperature  Myalgia

## 2017-02-15 ENCOUNTER — Inpatient Hospital Stay (INDEPENDENT_AMBULATORY_CARE_PROVIDER_SITE_OTHER): Payer: Self-pay

## 2017-02-15 ENCOUNTER — Ambulatory Visit (INDEPENDENT_AMBULATORY_CARE_PROVIDER_SITE_OTHER): Payer: BLUE CROSS/BLUE SHIELD | Admitting: Rheumatology

## 2017-02-15 DIAGNOSIS — M79642 Pain in left hand: Secondary | ICD-10-CM | POA: Diagnosis not present

## 2017-02-15 DIAGNOSIS — M79641 Pain in right hand: Secondary | ICD-10-CM | POA: Diagnosis not present

## 2017-03-30 ENCOUNTER — Ambulatory Visit: Payer: BLUE CROSS/BLUE SHIELD | Admitting: Rheumatology

## 2017-04-20 ENCOUNTER — Encounter (INDEPENDENT_AMBULATORY_CARE_PROVIDER_SITE_OTHER): Payer: Self-pay

## 2017-04-20 ENCOUNTER — Ambulatory Visit (INDEPENDENT_AMBULATORY_CARE_PROVIDER_SITE_OTHER): Payer: Self-pay | Admitting: Neurology

## 2017-04-20 ENCOUNTER — Encounter: Payer: Self-pay | Admitting: Neurology

## 2017-04-20 DIAGNOSIS — G43709 Chronic migraine without aura, not intractable, without status migrainosus: Secondary | ICD-10-CM

## 2017-04-20 DIAGNOSIS — IMO0002 Reserved for concepts with insufficient information to code with codable children: Secondary | ICD-10-CM | POA: Insufficient documentation

## 2017-04-20 MED ORDER — NORTRIPTYLINE HCL 10 MG PO CAPS: 20.0000 mg | ORAL_CAPSULE | Freq: Every day | ORAL | 11 refills | Status: DC

## 2017-04-20 MED ORDER — SUMATRIPTAN SUCCINATE 25 MG PO TABS: 25.0000 mg | ORAL_TABLET | ORAL | 11 refills | Status: DC | PRN

## 2017-04-20 MED ORDER — TIZANIDINE HCL 2 MG PO CAPS: 2.0000 mg | ORAL_CAPSULE | Freq: Three times a day (TID) | ORAL | 3 refills | Status: DC | PRN

## 2017-04-20 NOTE — Patient Instructions (Signed)
Magnesium oxide 400 mg twice a day Riboflavin= vitamin B2 100 mg twice a day as migraine prevention  Use heating pad for neck pain
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
## 2017-06-09 ENCOUNTER — Emergency Department (HOSPITAL_COMMUNITY)
Admission: EM | Admit: 2017-06-09 | Discharge: 2017-06-09 | Disposition: A | Discharge status: Home / Self Care | Payer: BLUE CROSS/BLUE SHIELD | Attending: Emergency Medicine | Admitting: Emergency Medicine

## 2017-06-09 ENCOUNTER — Encounter (HOSPITAL_COMMUNITY): Payer: Self-pay

## 2017-06-09 ENCOUNTER — Emergency Department (HOSPITAL_COMMUNITY): Payer: BLUE CROSS/BLUE SHIELD

## 2017-06-09 ENCOUNTER — Other Ambulatory Visit: Payer: Self-pay

## 2017-06-09 DIAGNOSIS — X503XXA Overexertion from repetitive movements, initial encounter: Secondary | ICD-10-CM | POA: Insufficient documentation

## 2017-06-09 DIAGNOSIS — Z79899 Other long term (current) drug therapy: Secondary | ICD-10-CM | POA: Diagnosis not present

## 2017-06-09 DIAGNOSIS — S76219A Strain of adductor muscle, fascia and tendon of unspecified thigh, initial encounter: Secondary | ICD-10-CM | POA: Diagnosis not present

## 2017-06-09 DIAGNOSIS — Y9301 Activity, walking, marching and hiking: Secondary | ICD-10-CM | POA: Diagnosis not present

## 2017-06-09 DIAGNOSIS — S79922A Unspecified injury of left thigh, initial encounter: Secondary | ICD-10-CM | POA: Diagnosis present

## 2017-06-09 DIAGNOSIS — J45909 Unspecified asthma, uncomplicated: Secondary | ICD-10-CM | POA: Diagnosis not present

## 2017-06-09 DIAGNOSIS — Y990 Civilian activity done for income or pay: Secondary | ICD-10-CM | POA: Diagnosis not present

## 2017-06-09 DIAGNOSIS — Y92511 Restaurant or cafe as the place of occurrence of the external cause: Secondary | ICD-10-CM | POA: Insufficient documentation

## 2017-06-09 LAB — I-STAT BETA HCG BLOOD, ED (MC, WL, AP ONLY): I-stat hCG, quantitative: <5 m[IU]/mL (ref <5)

## 2017-06-09 MED ORDER — ONDANSETRON HCL 4 MG PO TABS
4.0000 mg | ORAL_TABLET | Freq: Once | ORAL | Status: AC
  Administered 2017-06-09: 4 mg via ORAL
  Filled 2017-06-09: qty 1

## 2017-06-09 MED ORDER — HYDROMORPHONE HCL 1 MG/ML IJ SOLN
1.0000 mg | Freq: Once | INTRAMUSCULAR | Status: AC
  Administered 2017-06-09: 1 mg via INTRAMUSCULAR
  Filled 2017-06-09: qty 1

## 2017-06-09 MED ORDER — CYCLOBENZAPRINE HCL 10 MG PO TABS: 10.0000 mg | ORAL_TABLET | Freq: Two times a day (BID) | ORAL | 0 refills | Status: DC | PRN

## 2017-06-09 MED ORDER — KETOROLAC TROMETHAMINE 60 MG/2ML IM SOLN
30.0000 mg | Freq: Once | INTRAMUSCULAR | Status: AC
  Administered 2017-06-09: 30 mg via INTRAMUSCULAR
  Filled 2017-06-09: qty 2

## 2017-06-09 MED ORDER — HYDROCODONE-ACETAMINOPHEN 5-325 MG PO TABS: 1.0000 | ORAL_TABLET | Freq: Four times a day (QID) | ORAL | 0 refills | Status: DC | PRN

## 2017-06-09 NOTE — ED Provider Notes (Signed)
[facility] EMERGENCY DEPARTMENT Provider Note   CSN: [id number] Arrival date & time: 06/09/17  1051     History   Chief Complaint Chief Complaint  Patient presents with  . Leg Pain    HPI Leah Wise is a 21 y.o. female presents with left upper leg pain that began last night.  Patient reports that initially symptoms began last night while she was at work.  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  Patient reports that she started noticing some pain in the left upper leg/hip area.  She states that she took 1 dose of ibuprofen and continue to work.  Patient reports that throughout the night, she noticed she had to limp more because ambulating made the pain worse.  Patient reports that this morning, pain had significantly worsened, prompting ED visit.  Patient reports difficulty ambulating and bearing weight secondary to pain.  She reports that pain is worsened with range of motion of her left lower extremity.  She is not taking any medications today for the pain.  Patient denies any redness or swelling of the hip or leg.  Patient denies any preceding trauma, injury, fall.  Patient denies any fevers, chills, numbness, dysuria, hematuria, abdominal pain.  The history is provided by the patient.    Past Medical History:  Diagnosis Date  . Asthma   . Back pain   . Concussion 10/15/2016  . Headache   . Neck pain   . Rheumatoid arthritis [facility])     Patient Active Problem List   Diagnosis Date Noted  . Chronic migraine 04/20/2017  . Myalgia 12/16/2016  . Rash and other nonspecific skin eruption 12/16/2016  . High risk medication use 12/16/2016  . Rheumatoid arthritis involving multiple sites with positive rheumatoid factor ([place]) 10/26/2016  . Pain in both hands 10/26/2016    History reviewed. No pertinent surgical history.  OB History    No data available       Home Medications    Prior to Admission medications     Medication Sig Start Date End Date Taking? Authorizing Provider  cyclobenzaprine (FLEXERIL) 10 MG tablet Take 1 tablet (10 mg total) 2 (two) times daily as needed by mouth for muscle spasms. 06/09/17   Volanda Napoleon, PA-C  HYDROcodone-acetaminophen (NORCO/VICODIN) 5-325 MG tablet Take 1-2 tablets every 6 (six) hours as needed by mouth. 06/09/17   Volanda Napoleon, PA-C  hydroxychloroquine (PLAQUENIL) 200 MG tablet Take 1 tablet (200 mg total) by mouth 2 (two) times daily. Monday through Friday 12/22/16   Bo Merino, MD  Norgestim-Eth Radene Journey Triphasic (TRI-SPRINTEC PO) Take by mouth.    [provider]  nortriptyline (PAMELOR) 10 MG capsule Take 2 capsules (20 mg total) by mouth at bedtime. 04/20/17   Marcial Pacas, MD  SUMAtriptan (IMITREX) 25 MG tablet Take 1 tablet (25 mg total) by mouth every 2 (two) hours as needed for migraine. May repeat in 2 hours if headache persists or recurs. 04/20/17   Marcial Pacas, MD  tizanidine (ZANAFLEX) 2 MG capsule Take 1 capsule (2 mg total) by mouth 3 (three) times daily as needed for muscle spasms. 04/20/17   Marcial Pacas, MD    Family History Family History  Problem Relation Age of Onset  . Healthy Mother   . Healthy Father     Social History Social History   Tobacco Use  . Smoking status: Never Smoker  . Smokeless tobacco: Never Used  Substance Use Topics  . Alcohol use: Yes    Comment: Occasionally  . Drug use: No     Allergies   Patient has no known allergies.   Review of Systems Review of Systems  Constitutional: Negative for chills and fever.  Respiratory: Negative for cough and shortness of breath.   Cardiovascular: Negative for chest pain.  Gastrointestinal: Negative for abdominal pain, nausea and vomiting.  Genitourinary: Negative for dysuria and hematuria.  Musculoskeletal: Negative for back pain and neck pain.       Left leg pain  Neurological: Negative for weakness, numbness and headaches.     Physical  Exam Updated Vital Signs BP 104/69   Pulse 78   Temp 98 F (36.7 C) (Oral)   Ht 5\' 3"  (1.6 m)   Wt 49 kg (108 lb)   LMP 06/03/2017   SpO2 100%   BMI 19.13 kg/m   Physical Exam  Constitutional: She is oriented to person, place, and time. She appears well-developed and well-nourished.  Appears uncomfortable but no acute distress   HENT:  Head: Normocephalic and atraumatic.  Eyes: Conjunctivae, EOM and lids are normal. Pupils are equal, round, and reactive to light.  Neck: Full passive range of motion without pain.  Cardiovascular: Normal rate, regular rhythm, normal heart sounds and normal pulses. Exam reveals no gallop and no friction rub.  No murmur heard. Pulses:      Dorsalis pedis pulses are 2+ on the right side, and 2+ on the left side.  Pulmonary/Chest: Effort normal and breath sounds normal.  Abdominal: Soft. Normal appearance. There is no tenderness. There is no rigidity and no guarding.  Musculoskeletal: Normal range of motion.  Diffuse muscular tenderness palpation overlying the anterior aspect of the left thigh.  No deformity or crepitus noted.  No tenderness palpation to the left hip wound.  No deformity or crepitus noted.  No overlying warmth, erythema, ecchymosis.  Patient does have some pain that radiates into the anterior aspect of the groin.  No deformity or crepitus noted.  No tenderness palpation of bilateral knees, ankles.  Limited active range of motion secondary to pain.  Patient reports subjective pain with passive range of motion of the left lower extremity.  Full range of motion of right lower extremity without any difficulty.  Point of flexion and dorsiflexion of bilateral ankles intact without any difficulty.  Neurological: She is alert and oriented to person, place, and time.  Sensation intact along major nerve distributions of BLE  Skin: Skin is warm and dry. Capillary refill takes less than 2 seconds.  No warmth, erythema, ecchymosis noted to the left lower  extremity. LLE is not dusky in appearance or cool to touch.  Psychiatric: She has a normal mood and affect. Her speech is normal.  Nursing note and vitals reviewed.    ED Treatments / Results  Labs (all labs ordered are listed, but only abnormal results are displayed) Labs Reviewed  POC URINE PREG, ED  I-STAT BETA HCG BLOOD, ED ([facility], [facility], [facility] ONLY)    EKG  EKG Interpretation None       Radiology No results found.  Procedures Procedures (including critical care time)  Medications Ordered in ED Medications  HYDROmorphone (DILAUDID) injection 1 mg (1 mg Intramuscular Given 06/09/17 1256)  ketorolac (TORADOL) injection 30 mg (30 mg Intramuscular Given 06/09/17 1354)  ondansetron (ZOFRAN) tablet 4 mg (4 mg Oral Given 06/09/17 1646)     Initial Impression / Assessment and Plan / ED Course  I have reviewed the triage vital signs and the nursing notes.  Pertinent labs & imaging results that were available during my care of the patient were reviewed by me and considered in my medical decision making (see chart for details).     21 y.o. F who presents with left leg pain that began last night. No history of trauma, fall, injury. No fevers, redness, swelling. Patient is afebrile, non-toxic appearing. Vital signs reviewed and stable.  Physical exam shows diffuse tenderness palpation overlying the muscle of the anterior aspect of the left thigh.  Patient does not have tenderness palpation of the actual hip bone on the left side but she does have some radiation into the groin.  No overlying redness/swelling.  Consider muscle strain.  Low suspicion for fracture versus dislocation but also consideration.  History/physical exam are not concerning for DVT or septic arthritis. Plan to obtain XR imaging for further evaluation. Analgesics provided in the department.  Reevaluation after initial round of pain medications.  Patient reports no improvement in pain medication.  Physical exam still very  limited secondary to patient's pain.  Will give additional analgesics.  X-ray reviewed.  Negative for any acute fracture or dislocation.  Patient was able to transfer from the examination table to the wheelchair without any difficulty.  Reevaluation after second round of pain medications.  Patient reports improvement in pain.  She is able to tolerate passive range of motion without any difficulty.  Still has limited active range of motion secondary to patient's pain.  Given same distribution of the pain, do not suspect occult hip fracture.  Do not feel that further CT imaging of the left hip is indicated in this incident.  Discussed patient with Dr. Regenia Skeeter who independently evaluated patient.  Suspect symptoms are secondary to muscle strain/spasm.  Will plan to treat with muscle relaxer.  Encouraged patient on NSAID use at home. Provided patient with a list of clinic resources to use if he does not have a PCP. Instructed to call them today to arrange follow-up in the next 24-48 hours. Strict return precautions discussed. Patient expresses understanding and agreement to plan.   Final Clinical Impressions(s) / ED Diagnoses   Final diagnoses:  Groin strain, initial encounter    ED Discharge Orders        Ordered    cyclobenzaprine (FLEXERIL) 10 MG tablet  2 times daily PRN     06/09/17 1613    HYDROcodone-acetaminophen (NORCO/VICODIN) 5-325 MG tablet  Every 6 hours PRN     06/09/17 1613       Volanda Napoleon, PA-C 06/11/17 2350    Sherwood Gambler, MD 06/12/17 [phone]

## 2017-06-09 NOTE — ED Triage Notes (Signed)
Pt endorses left upper leg pain that began yesterday and states "I can't walk" . Pt denies injury. CMS intact. VSS.

## 2017-06-09 NOTE — Discharge Instructions (Signed)
You can take Tylenol or Ibuprofen as directed for pain. You can alternate Tylenol and Ibuprofen every 4 hours. If you take Tylenol at 1pm, then you can take Ibuprofen at 5pm. Then you can take Tylenol again at 9pm.   Take the pain medication for severe breakthrough pain.  Do not take it at the same time as a Tylenol or ibuprofen.  Follow the RICE (Rest, Ice, Compression, Elevation) protocol as directed.   Take Flexeril as prescribed. This medication will make you drowsy so do not drive or drink alcohol when taking it.  Follow-up with your primary care doctor in the next 24-48 hours for further evaluation.  Return to emergency department for any worsening pain, difficulty walking, redness or swelling of the leg, numbness/weakness of the leg or any other worsening or concerning symptoms.

## 2017-06-09 NOTE — ED Notes (Signed)
Pt transported to radiology via wheelchair with tech.

## 2017-06-09 NOTE — ED Notes (Signed)
Left hip pain that started last night and she was limping now cannot bear wt denies anyinjury  Woke up with wor=sening pain

## 2017-06-18 NOTE — Progress Notes (Signed)
Office Visit Note  Patient: Leah Wise             Date of Birth: Jan 29, 1996           MRN: [id number]             PCP: Kennyth Arnold, FNP Referring: Kennyth Arnold, FNP Visit Date: 06/28/2017 Occupation: @GUAROCC @    Subjective:  Neck pain, hands and feet pain   History of Present Illness: Leah Wise is a 21 y.o. female with history of seropositive rheumatoid arthritis who is taking plaquenil 200 mg twice a day Monday through Friday.  She reports ankle, wrist, and hand pain.  She feels her joints have started hurting more due to the weather, but she feels Plaquenil has been helping considerably.  She denies any joint swelling.  She states her neck has been hurting but she has good ROM.  She has been tolerating Plaquenil.  She reports she has not had a Plaquenil eye exam since starting. She states that she had an episode in the beginning of November were she started limping and could not lift her left leg. She states the pain was severe. She was seen at the emergency room were she had evaluation and x-ray which was unremarkable. She states she was given some pain medications and was discharged in the symptoms have improved.      Activities of Daily Living:  Patient reports morning stiffness for 2 hours.   Patient Denies nocturnal pain.  Difficulty dressing/grooming: Denies Difficulty climbing stairs: Denies Difficulty getting out of chair: Denies Difficulty using hands for taps, buttons, cutlery, and/or writing: Reports   Review of Systems  Constitutional: Negative for fatigue, night sweats, weight gain, weight loss and weakness.  HENT: Negative for mouth sores, trouble swallowing, trouble swallowing, mouth dryness and nose dryness.   Eyes: Negative for pain, redness, visual disturbance and dryness.  Respiratory: Negative for cough, shortness of breath and difficulty breathing.   Cardiovascular: Negative for chest pain, palpitations, hypertension, irregular heartbeat and  swelling in legs/feet.  Gastrointestinal: Negative for blood in stool, constipation and diarrhea.  Endocrine: Negative for increased urination.  Genitourinary: Negative for vaginal dryness.  Musculoskeletal: Positive for arthralgias, joint pain and morning stiffness. Negative for joint swelling, myalgias, muscle weakness, muscle tenderness and myalgias.  Skin: Positive for rash. Negative for color change, hair loss, skin tightness, ulcers and sensitivity to sunlight.  Allergic/Immunologic: Negative for susceptible to infections.  Neurological: Negative for dizziness, memory loss and night sweats.  Hematological: Negative for swollen glands.  Psychiatric/Behavioral: Negative for depressed mood and sleep disturbance. The patient is nervous/anxious.     PMFS History:  Patient Active Problem List   Diagnosis Date Noted  . Chronic migraine 04/20/2017  . Myalgia 12/16/2016  . Rash and other nonspecific skin eruption 12/16/2016  . High risk medication use 12/16/2016  . Rheumatoid arthritis involving multiple sites with positive rheumatoid factor ([place]) 10/26/2016  . Pain in both hands 10/26/2016    Past Medical History:  Diagnosis Date  . Asthma   . Back pain   . Concussion 10/15/2016  . Headache   . Neck pain   . Rheumatoid arthritis ([place])     Family History  Problem Relation Age of Onset  . Healthy Mother   . Healthy Father   . Healthy Sister    History reviewed. No pertinent surgical history. Social History   Social History Narrative   Lives at home with roommate.   Right-handed.   Occasional  caffeine.     Objective: Vital Signs: BP 108/74 (BP Location: Left Arm, Patient Position: Sitting, Cuff Size: Normal)   Pulse 78   Resp 14   Ht 5\' 3"  (1.6 m)   Wt 107 lb (48.5 kg)   LMP 06/03/2017   BMI 18.95 kg/m    Physical Exam  Constitutional: She is oriented to person, place, and time. She appears well-developed and well-nourished.  HENT:  Head: Normocephalic and  atraumatic.  Eyes: Conjunctivae and EOM are normal.  Neck: Normal range of motion.  Cardiovascular: Normal rate, regular rhythm, normal heart sounds and intact distal pulses.  Pulmonary/Chest: Effort normal and breath sounds normal.  Abdominal: Soft. Bowel sounds are normal.  Lymphadenopathy:    She has no cervical adenopathy.  Neurological: She is alert and oriented to person, place, and time.  Skin: Skin is warm and dry. Capillary refill takes less than 2 seconds.  Psychiatric: She has a normal mood and affect. Her behavior is normal.  Nursing note and vitals reviewed.    Musculoskeletal Exam: C-spine, thoracic, and lumbar spine good ROM.  Shoulders joints, elbow joints, and knee joints good ROM.  MCPs, PIPs, and DIPs good ROM with no synovitis.  Hip joints, knee joints, and ankle joints good ROM with no synovitis.  MTPs, PIPs, and DIPs good ROM with no synovitis.    CDAI Exam: CDAI Homunculus Exam:   Joint Counts:  CDAI Tender Joint count: 0 CDAI Swollen Joint count: 0  Global Assessments:  Patient Global Assessment: 5 Provider Global Assessment: 2  CDAI Calculated Score: 7    Investigation: No additional findings.PLQ eye exam? CBC Latest Ref Rng & Units 12/28/2016 10/17/2016  WBC 3.8 - 10.8 K/uL 14.6(H) 4.4  Hemoglobin 11.7 - 15.5 g/dL 13.2 14.2  Hematocrit 35.0 - 45.0 % 38.6 42.2  Platelets 140 - 400 K/uL 259 219   CMP Latest Ref Rng & Units 12/28/2016 10/17/2016  Glucose 65 - 99 mg/dL 68 79  BUN 7 - 25 mg/dL 9 9  Creatinine 0.50 - 1.10 mg/dL 0.87 0.73  Sodium 135 - 146 mmol/L 139 139  Potassium 3.5 - 5.3 mmol/L 4.0 4.5  Chloride 98 - 110 mmol/L 105 107  CO2 20 - 31 mmol/L 23 23  Calcium 8.6 - 10.2 mg/dL 9.0 9.2  Total Protein 6.1 - 8.1 g/dL 6.6 7.6  Total Bilirubin 0.2 - 1.2 mg/dL 0.6 0.4  Alkaline Phos 33 - 115 U/L 30(L) 54  AST 10 - 30 U/L 13 24  ALT 6 - 29 U/L 13 22    Imaging: Dg Hip Unilat W Or Wo Pelvis 2-3 Views Left  Result Date: 06/09/2017 CLINICAL  DATA:  Initial evaluation for acute left hip pain. No injury. EXAM: DG HIP (WITH OR WITHOUT PELVIS) 2-3V LEFT COMPARISON:  None. FINDINGS: There is no evidence of hip fracture or dislocation. There is no evidence of arthropathy or other focal bone abnormality. IMPRESSION: No acute osseous abnormality about the left hip. Electronically Signed   By: Jeannine Boga M.D.   On: 06/09/2017 14:37   Dg Femur Min 2 Views Left  Result Date: 06/09/2017 CLINICAL DATA:  Left hip pain that started last night and she was limping now cannot bear wt .no injury ,no knee complaints EXAM: LEFT FEMUR 2 VIEWS COMPARISON:  None. FINDINGS: There is no evidence of fracture or other focal bone lesions. Soft tissues are unremarkable. IMPRESSION: Negative. Electronically Signed   By: Nolon Nations M.D.   On: 06/09/2017 14:36  Speciality Comments: No specialty comments available.    Procedures:  No procedures performed Allergies: Patient has no known allergies.   Assessment / Plan:     Visit Diagnoses: Rheumatoid arthritis involving multiple sites with positive rheumatoid factor (HCC Positive RF, positive anti-CCP, positive ANA, positive synovitis initially) - Patient is clinically doing well with no synovitis noted on exam.  She is having discomfort in her neck, hands, and feet but she has no joint swelling.  Patient will continue taking Plaquenil 200mg  twice daily Monday-Friday.  An eye exam will be scheduled for her. She has been noncompliant with getting eye exam.  Neck pain: She has good range of motion without any radiculopathy.  Left hip pain: She had episode of severe left hip joint pain for which she was evaluated in emergency room. Her symptoms responded to NSAIDs. She has good range of motion today. We had detailed discussion regarding treatment options. I believe at this point Plaquenil is not sufficient due to ongoing arthralgias and recent episode of left hip joint inflammation. Indications side  effects contraindications were discussed. She wants to proceed with sulfasalazine. A handout was given and consent was obtained. We will obtain G6PD today and once we have results available we can call in the prescription for her.  Medication counseling:  G6PD: Pending Patient was counseled on the purpose, proper use, and adverse effects of sulfasalazine including risk of infection and chance of nausea, headache, and sun sensitivity.  Also discussed risk of skin rash and advised patient to stop the medication and let us know if she develops a rash. Also discussed for the potential of discoloration of the urine, sweat, or tears.  Reviewed the importance of frequent labs to monitor liver, kidneys, and blood counts; and provided patient with standing lab instructions.  Provided patient with educational materials on sulfasalazine and answered all questions.     High risk medication use - Hydroxychloroquine 200 mg by mouth twice a day Mon - Fri.  An eye appointment will be scheduled for her to have her Plaquenil eye exam performed. Patient did not have a baseline Plaquenil eye exam in the past.  She was counseled on the importance of yearly eye exams while taking Plaquenil.    Rash - History of rash - Patient continues to have rash appear on face when exposed to cold temperatures.  Denies Raynaud's symptoms of fingers or toes.   Myalgia: No muscle aches at this time.    History of migraine    Orders: Orders Placed This Encounter  Procedures  . CBC with Differential/Platelet  . COMPLETE METABOLIC PANEL WITH GFR  . Glucose 6 phosphate dehydrogenase   No orders of the defined types were placed in this encounter.   Face-to-face time spent with patient was 30 minutes. Greater than 50% of time was spent in counseling and coordination of care.  Follow-Up Instructions: Return in about 3 months (around 09/28/2017) for Rheumatoid arthritis.   Bo Merino, MD  Note - This record has been created  using Editor, commissioning.  Chart creation errors have been sought, but may not always  have been located. Such creation errors do not reflect on  the standard of medical care.

## 2017-06-28 ENCOUNTER — Telehealth: Payer: Self-pay | Admitting: Rheumatology

## 2017-06-28 ENCOUNTER — Encounter: Payer: Self-pay | Admitting: Rheumatology

## 2017-06-28 ENCOUNTER — Ambulatory Visit (INDEPENDENT_AMBULATORY_CARE_PROVIDER_SITE_OTHER): Payer: BLUE CROSS/BLUE SHIELD | Admitting: Rheumatology

## 2017-06-28 VITALS — BP 108/74 | HR 78 | Resp 14 | Ht 63.0 in | Wt 107.0 lb

## 2017-06-28 DIAGNOSIS — Z79899 Other long term (current) drug therapy: Secondary | ICD-10-CM

## 2017-06-28 DIAGNOSIS — M542 Cervicalgia: Secondary | ICD-10-CM

## 2017-06-28 DIAGNOSIS — Z8669 Personal history of other diseases of the nervous system and sense organs: Secondary | ICD-10-CM

## 2017-06-28 DIAGNOSIS — M0579 Rheumatoid arthritis with rheumatoid factor of multiple sites without organ or systems involvement: Secondary | ICD-10-CM

## 2017-06-28 DIAGNOSIS — R21 Rash and other nonspecific skin eruption: Secondary | ICD-10-CM

## 2017-06-28 DIAGNOSIS — M25552 Pain in left hip: Secondary | ICD-10-CM

## 2017-06-28 DIAGNOSIS — M791 Myalgia, unspecified site: Secondary | ICD-10-CM

## 2017-06-28 NOTE — Telephone Encounter (Signed)
Patient called to let us know that she is not able to see the eye doctor until February.  She would like someone to call her back.  CB#[phone].  Thank you.

## 2017-06-28 NOTE — Patient Instructions (Addendum)
Standing Labs We placed an order today for your standing lab work.    Please come back and get your standing labs in 1 month after sulfasalazine and then every 3 months.  We have open lab Monday through Friday from 8:30-11:30 AM and 1:30-4 PM at the office of Dr. Bo Merino.   The office is located at [address], [place], [place],  [address] No appointment is necessary.   Labs are drawn by [facility].  You may receive a bill from [facility] for your lab work. If you have any questions regarding directions or hours of operation,  please call [phone].        Sulfasalazine delayed release tablets What is this medicine? SULFASALAZINE (sul fa SAL a zeen) is for ulcerative colitis and certain types of rheumatoid arthritis. This medicine may be used for other purposes; ask your health care provider or pharmacist if you have questions. COMMON BRAND NAME(S): Azulfidine En-Tabs, Sulfazine EC What should I tell my health care provider before I take this medicine? They need to know if you have any of these conditions: -asthma -blood disorders or anemia -glucose-6-phosphate dehydrogenase (G6PD) deficiency -intestinal obstruction -kidney disease -liver disease -porphyria -urinary tract obstruction -an unusual reaction to sulfasalazine, sulfa drugs, salicylates, other medicines, foods, dyes, or preservatives -pregnant or trying to get pregnant -breast-feeding How should I use this medicine? Take this medicine by mouth with a full glass of water. Follow the directions on the prescription label. If the medicine upsets your stomach, take it with food or milk. Do not crush or chew the tablets. Swallow the tablets whole. Take your medicine at regular intervals. Do not take your medicine more often than directed. Do not stop taking except on your doctor's advice. Talk to your pediatrician regarding the use of this medicine in children. Special care may be needed. While this drug may  be prescribed for children as young as 6 years for selected conditions, precautions do apply. Patients over 46 years old may have a stronger reaction and need a smaller dose. Overdosage: If you think you have taken too much of this medicine contact a poison control center or emergency room at once. NOTE: This medicine is only for you. Do not share this medicine with others. What if I miss a dose? If you miss a dose, take it as soon as you can. If it is almost time for your next dose, take only that dose. Do not take double or extra doses. What may interact with this medicine? -digoxin -folic acid This list may not describe all possible interactions. Give your health care provider a list of all the medicines, herbs, non-prescription drugs, or dietary supplements you use. Also tell them if you smoke, drink alcohol, or use illegal drugs. Some items may interact with your medicine. What should I watch for while using this medicine? Visit your doctor or health care professional for regular checks on your progress. You will need frequent blood and urine checks. This medicine can make you more sensitive to the sun. Keep out of the sun. If you cannot avoid being in the sun, wear protective clothing and use sunscreen. Do not use sun lamps or tanning beds/booths. Drink plenty of water while taking this medicine. Tell your doctor if you see the tablet in your stools. Your body may not be absorbing the medicine. What side effects may I notice from receiving this medicine? Side effects that you should report to your doctor or health care professional as soon as  possible: -allergic reactions like skin rash, itching or hives, swelling of the face, lips, or tongue -fever, chills, or any other sign of infection -painful, difficult, or reduced urination -redness, blistering, peeling or loosening of the skin, including inside the mouth -severe stomach pain -unusual bleeding or bruising -unusually weak or  tired -yellowing of the skin or eyes Side effects that usually do not require medical attention (report to your doctor or health care professional if they continue or are bothersome): -headache -loss of appetite -nausea, vomiting -orange color to the urine -reduced sperm count This list may not describe all possible side effects. Call your doctor for medical advice about side effects. You may report side effects to FDA at 1-800-FDA-1088. Where should I keep my medicine? Keep out of the reach of children. Store at room temperature between 15 and 30 degrees C (59 and 86 degrees F). Keep container tightly closed. Throw away any unused medicine after the expiration date. NOTE: This sheet is a summary. It may not cover all possible information. If you have questions about this medicine, talk to your doctor, pharmacist, or health care provider.  2018 Elsevier/Gold Standard (2008-03-21 13:02:26)

## 2017-06-29 ENCOUNTER — Telehealth: Payer: Self-pay

## 2017-06-29 LAB — COMPLETE METABOLIC PANEL WITH GFR
AG Ratio: 1.4 (calc) (ref 1.0–2.5)
ALBUMIN MSPROF: 4.1 g/dL (ref 3.6–5.1)
ALKALINE PHOSPHATASE (APISO): 34 U/L (ref 33–115)
ALT: 13 U/L (ref 6–29)
AST: 17 U/L (ref 10–30)
BILIRUBIN TOTAL: 0.8 mg/dL (ref 0.2–1.2)
BUN: 11 mg/dL (ref 7–25)
CHLORIDE: 105 mmol/L (ref 98–110)
CO2: 27 mmol/L (ref 20–32)
CREATININE: 0.77 mg/dL (ref 0.50–1.10)
Calcium: 9.4 mg/dL (ref 8.6–10.2)
GFR, Est African American: 126 mL/min/{1.73_m2} (ref >=60)
GFR, Est Non African American: 109 mL/min/{1.73_m2} (ref >=60)
Globulin: 2.9 g/dL (calc) (ref 1.9–3.7)
Glucose, Bld: 72 mg/dL (ref 65–99)
Potassium: 4.2 mmol/L (ref 3.5–5.3)
Sodium: 139 mmol/L (ref 135–146)
Total Protein: 7 g/dL (ref 6.1–8.1)

## 2017-06-29 LAB — CBC WITH DIFFERENTIAL/PLATELET
BASOS ABS: 41 {cells}/uL (ref 0–200)
Basophils Relative: 0.7 %
EOS PCT: 1.7 %
Eosinophils Absolute: 100 cells/uL (ref 15–500)
HCT: 36.3 % (ref 35.0–45.0)
Hemoglobin: 12.3 g/dL (ref 11.7–15.5)
Lymphs Abs: 974 cells/uL (ref 850–3900)
MCH: 29.7 pg (ref 27.0–33.0)
MCHC: 33.9 g/dL (ref 32.0–36.0)
MCV: 87.7 fL (ref 80.0–100.0)
MONOS PCT: 8.8 %
MPV: 11.4 fL (ref 7.5–12.5)
NEUTROS PCT: 72.3 %
Neutro Abs: 4266 cells/uL (ref 1500–7800)
PLATELETS: 261 10*3/uL (ref 140–400)
RBC: 4.14 10*6/uL (ref 3.80–5.10)
RDW: 11.8 % (ref 11.0–15.0)
TOTAL LYMPHOCYTE: 16.5 %
WBC: 5.9 10*3/uL (ref 3.8–10.8)
WBCMIX: 519 {cells}/uL (ref 200–950)

## 2017-06-29 LAB — GLUCOSE 6 PHOSPHATE DEHYDROGENASE: G-6PDH: 6.1 U/g Hgb — ABNORMAL LOW (ref 7.0–20.5)

## 2017-06-29 NOTE — Telephone Encounter (Signed)
See previous phone note.  

## 2017-06-29 NOTE — Telephone Encounter (Signed)
Attempted to contact the patient and left message for patient to call the office.  

## 2017-06-29 NOTE — Progress Notes (Signed)
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 Methotrexate will be a better option. Please let patient now. If she is interested in starting her methotrexate. We'll have to schedule a visit.

## 2017-06-29 NOTE — Telephone Encounter (Signed)
Patient was returning Andrea's call.  Cb# is [phone].  Please advise.  Thank you.

## 2017-06-29 NOTE — Telephone Encounter (Signed)
Called Dr. Ammie Ferrier office and patient now has scheduled appointment for 07/12/2017 at 3:30 pm. Patient advised of appointment date and time. Address was provided to patient. Patient verbalized understanding.

## 2017-06-29 NOTE — Telephone Encounter (Signed)
Patient states she was given a call from Dr. Norina Buzzard office and that their first available appointment is in February for a PLQ eye exam. Will try to schedule with Dr. Katy Fitch.

## 2017-06-29 NOTE — Progress Notes (Signed)
WNLs

## 2017-07-09 ENCOUNTER — Other Ambulatory Visit: Payer: Self-pay | Admitting: Rheumatology

## 2017-07-12 NOTE — Telephone Encounter (Signed)
Last visit: 06/28/17 Next Visit: 09/26/17 Labs: 06/28/17 WNL Patient is having PLQ eye exam done today.   Okay to refill 30 day supply PLQ?

## 2017-07-12 NOTE — Telephone Encounter (Signed)
ok 

## 2017-07-13 NOTE — Progress Notes (Signed)
Office Visit Note  Patient: Leah Wise             Date of Birth: [birth year]           MRN: [id number]             PCP: Kennyth Arnold, FNP Referring: Kennyth Arnold, FNP Visit Date: 07/17/2017 Occupation: @GUAROCC @    Subjective:  Joint pain.   History of Present Illness: Leah Wise is a 21 y.o. female with history of rheumatoid arthritis. She states she has noticed some improvement on Plaquenil. She still continues to have some joint discomfort. She complains of pain and discomfort in her bilateral wrist joints, bilateral fingers and bilateral knee joints. She's been having some stiffness in her TMJ bilateral. She has not noticed any joint swelling. She has had no recurrence of rash on her face. She complains of some fingers turning red and colder temperatures.  Activities of Daily Living:  Patient reports morning stiffness for 4 hours.   Patient Reports nocturnal pain.  Difficulty dressing/grooming: Denies Difficulty climbing stairs: Reports Difficulty getting out of chair: Reports Difficulty using hands for taps, buttons, cutlery, and/or writing: Reports   Review of Systems  Constitutional: Positive for fatigue. Negative for night sweats, weight gain, weight loss and weakness.  HENT: Negative for mouth sores, trouble swallowing, trouble swallowing, mouth dryness and nose dryness.   Eyes: Negative for pain, redness, visual disturbance and dryness.  Respiratory: Negative for cough, shortness of breath and difficulty breathing.   Cardiovascular: Negative for chest pain, palpitations, hypertension, irregular heartbeat and swelling in legs/feet.  Gastrointestinal: Negative for blood in stool, constipation and diarrhea.  Endocrine: Negative for increased urination.  Genitourinary: Negative for vaginal dryness.  Musculoskeletal: Positive for arthralgias, joint pain and morning stiffness. Negative for joint swelling, myalgias, muscle weakness, muscle tenderness and  myalgias.  Skin: Negative for color change, rash, hair loss, skin tightness, ulcers and sensitivity to sunlight.  Allergic/Immunologic: Negative for susceptible to infections.  Neurological: Negative for dizziness, memory loss and night sweats.  Hematological: Negative for swollen glands.  Psychiatric/Behavioral: Negative for depressed mood and sleep disturbance. The patient is not nervous/anxious.     PMFS History:  Patient Active Problem List   Diagnosis Date Noted  . Chronic migraine 04/20/2017  . Myalgia 12/16/2016  . Rash and other nonspecific skin eruption 12/16/2016  . High risk medication use 12/16/2016  . Rheumatoid arthritis involving multiple sites with positive rheumatoid factor ([place]) 10/26/2016  . Pain in both hands 10/26/2016    Past Medical History:  Diagnosis Date  . Asthma   . Back pain   . Concussion 10/15/2016  . Headache   . Neck pain   . Rheumatoid arthritis ([place])     Family History  Problem Relation Age of Onset  . Healthy Mother   . Healthy Father   . Healthy Sister    History reviewed. No pertinent surgical history. Social History   Social History Narrative   Lives at home with roommate.   Right-handed.   Occasional caffeine.     Objective: Vital Signs: BP 115/73 (BP Location: Left Arm, Patient Position: Sitting, Cuff Size: Normal)   Pulse 68   Resp 14   Ht 5\' 3"  (1.6 m)   Wt 111 lb (50.3 kg)   BMI 19.66 kg/m    Physical Exam  Constitutional: She is oriented to person, place, and time. She appears well-developed and well-nourished.  HENT:  Head: Normocephalic and atraumatic.  Eyes: Conjunctivae and  EOM are normal.  Neck: Normal range of motion.  Cardiovascular: Normal rate, regular rhythm, normal heart sounds and intact distal pulses.  Pulmonary/Chest: Effort normal and breath sounds normal.  Abdominal: Soft. Bowel sounds are normal.  Lymphadenopathy:    She has no cervical adenopathy.  Neurological: She is alert and oriented to  person, place, and time.  Skin: Skin is warm and dry. Capillary refill takes less than 2 seconds.  Psychiatric: She has a normal mood and affect. Her behavior is normal.  Nursing note and vitals reviewed.    Musculoskeletal Exam: C-spine, thoracic, lumbar spine good range of motion. Shoulder joints although joints wrist joint MCPs PIPs DIPs with good range of motion with no synovitis. Hip joints knee joints ankles MTPs PIPs DIPs of good range of motion with no synovitis.  CDAI Exam: CDAI Homunculus Exam:   Tenderness:  Right temporomandibular and left temporomandibular  Joint Counts:  CDAI Tender Joint count: 0 CDAI Swollen Joint count: 0  Global Assessments:  Patient Global Assessment: 5 Provider Global Assessment: 4  CDAI Calculated Score: 9    Investigation: No additional findings. CBC Latest Ref Rng & Units 06/28/2017 12/28/2016 10/17/2016  WBC 3.8 - 10.8 Thousand/uL 5.9 14.6(H) 4.4  Hemoglobin 11.7 - 15.5 g/dL 12.3 13.2 14.2  Hematocrit 35.0 - 45.0 % 36.3 38.6 42.2  Platelets 140 - 400 Thousand/uL 261 259 219   CMP Latest Ref Rng & Units 06/28/2017 12/28/2016 10/17/2016  Glucose 65 - 99 mg/dL 72 68 79  BUN 7 - 25 mg/dL 11 9 9   Creatinine 0.50 - 1.10 mg/dL 0.77 0.87 0.73  Sodium 135 - 146 mmol/L 139 139 139  Potassium 3.5 - 5.3 mmol/L 4.2 4.0 4.5  Chloride 98 - 110 mmol/L 105 105 107  CO2 20 - 32 mmol/L 27 23 23   Calcium 8.6 - 10.2 mg/dL 9.4 9.0 9.2  Total Protein 6.1 - 8.1 g/dL 7.0 6.6 7.6  Total Bilirubin 0.2 - 1.2 mg/dL 0.8 0.6 0.4  Alkaline Phos 33 - 115 U/L - 30(L) 54  AST 10 - 30 U/L 17 13 24   ALT 6 - 29 U/L 13 13 22     Imaging: No results found.  Speciality Comments: No specialty comments available.    Procedures:  No procedures performed Allergies: Patient has no known allergies.   Assessment / Plan:     Visit Diagnoses: Rheumatoid arthritis involving multiple sites with positive rheumatoid factor (HCC) - Positive RF, positive anti-CCP, positive  ANA, positive synovitis initially) . Patient continues to have some arthralgias but has no synovitis on examination.  High risk medication use - PLQ 200 mg by mouth twice a day Monday through Fridayeye exam 07/12/2017 . She will get labs and April and then every 5 months.  Raynaud's phenomenon without gangrene: Warm clothing and keeping body temperature warm was discussed.  Myalgia: She does have some generalized myalgias but no muscle weakness.  History of migraine  Rash - History of rash - Patient continues to have rash appear on face when exposed to cold temperatures. She's had no recurrence of rash since she started taking Plaquenil.  G6PD deficiency ([place]) : I discussed that she would not be able to take sulfasalazine.   Orders: No orders of the defined types were placed in this encounter.  No orders of the defined types were placed in this encounter.   Face-to-face time spent with patient was 30 minutes. Greater than 50% of time was spent in counseling and coordination of care.  Follow-Up  Instructions: Return in about 5 months (around 12/15/2017) for Rheumatoid arthritis.   Bo Merino, MD  Note - This record has been created using Editor, commissioning.  Chart creation errors have been sought, but may not always  have been located. Such creation errors do not reflect on  the standard of medical care.

## 2017-07-17 ENCOUNTER — Ambulatory Visit: Payer: BLUE CROSS/BLUE SHIELD | Admitting: Rheumatology

## 2017-07-17 ENCOUNTER — Encounter: Payer: Self-pay | Admitting: Rheumatology

## 2017-07-17 VITALS — BP 115/73 | HR 68 | Resp 14 | Ht 63.0 in | Wt 111.0 lb

## 2017-07-17 DIAGNOSIS — D550 Anemia due to glucose-6-phosphate dehydrogenase [G6PD] deficiency: Secondary | ICD-10-CM | POA: Diagnosis not present

## 2017-07-17 DIAGNOSIS — M0579 Rheumatoid arthritis with rheumatoid factor of multiple sites without organ or systems involvement: Secondary | ICD-10-CM

## 2017-07-17 DIAGNOSIS — M791 Myalgia, unspecified site: Secondary | ICD-10-CM | POA: Diagnosis not present

## 2017-07-17 DIAGNOSIS — I7300 Raynaud's syndrome without gangrene: Secondary | ICD-10-CM

## 2017-07-17 DIAGNOSIS — Z8669 Personal history of other diseases of the nervous system and sense organs: Secondary | ICD-10-CM | POA: Diagnosis not present

## 2017-07-17 DIAGNOSIS — Z79899 Other long term (current) drug therapy: Secondary | ICD-10-CM

## 2017-07-17 DIAGNOSIS — R21 Rash and other nonspecific skin eruption: Secondary | ICD-10-CM | POA: Diagnosis not present

## 2017-07-17 DIAGNOSIS — D75A Glucose-6-phosphate dehydrogenase (G6PD) deficiency without anemia: Secondary | ICD-10-CM

## 2017-07-17 NOTE — Patient Instructions (Signed)
Standing Labs We placed an order today for your standing lab work.    Please come back and get your standing labs in April and every 5 months  We have open lab Monday through Friday from 8:30-11:30 AM and 1:30-4 PM at the office of Dr. Bo Merino.   The office is located at [address], [place], [place], [place] [address] No appointment is necessary.   Labs are drawn by [facility].  You may receive a bill from [facility] for your lab work. If you have any questions regarding directions or hours of operation,  please call [phone].

## 2017-09-12 NOTE — Progress Notes (Deleted)
Office Visit Note  Patient: Leah Wise             Date of Birth: June 11, 1996           MRN: [id number]             PCP: Kennyth Arnold, FNP Referring: Kennyth Arnold, FNP Visit Date: 09/26/2017 Occupation: @GUAROCC @    Subjective:  No chief complaint on file.   History of Present Illness: Leah Wise is a 22 y.o. female ***   Activities of Daily Living:  Patient reports morning stiffness for *** {minute/hour:19697}.   Patient {ACTIONS;DENIES/REPORTS:21021675::"Denies"} nocturnal pain.  Difficulty dressing/grooming: {ACTIONS;DENIES/REPORTS:21021675::"Denies"} Difficulty climbing stairs: {ACTIONS;DENIES/REPORTS:21021675::"Denies"} Difficulty getting out of chair: {ACTIONS;DENIES/REPORTS:21021675::"Denies"} Difficulty using hands for taps, buttons, cutlery, and/or writing: {ACTIONS;DENIES/REPORTS:21021675::"Denies"}   No Rheumatology ROS completed.   PMFS History:  Patient Active Problem List   Diagnosis Date Noted  . Chronic migraine 04/20/2017  . Myalgia 12/16/2016  . Rash and other nonspecific skin eruption 12/16/2016  . High risk medication use 12/16/2016  . Rheumatoid arthritis involving multiple sites with positive rheumatoid factor ([place]) 10/26/2016  . Pain in both hands 10/26/2016    Past Medical History:  Diagnosis Date  . Asthma   . Back pain   . Concussion 10/15/2016  . Headache   . Neck pain   . Rheumatoid arthritis ([place])     Family History  Problem Relation Age of Onset  . Healthy Mother   . Healthy Father   . Healthy Sister    No past surgical history on file. Social History   Social History Narrative   Lives at home with roommate.   Right-handed.   Occasional caffeine.     Objective: Vital Signs: There were no vitals taken for this visit.   Physical Exam   Musculoskeletal Exam: ***  CDAI Exam: No CDAI exam completed.    Investigation: No additional findings. CBC Latest Ref Rng & Units 06/28/2017 12/28/2016 10/17/2016    WBC 3.8 - 10.8 Thousand/uL 5.9 14.6(H) 4.4  Hemoglobin 11.7 - 15.5 g/dL 12.3 13.2 14.2  Hematocrit 35.0 - 45.0 % 36.3 38.6 42.2  Platelets 140 - 400 Thousand/uL 261 259 219   CMP Latest Ref Rng & Units 06/28/2017 12/28/2016 10/17/2016  Glucose 65 - 99 mg/dL 72 68 79  BUN 7 - 25 mg/dL 11 9 9   Creatinine 0.50 - 1.10 mg/dL 0.77 0.87 0.73  Sodium 135 - 146 mmol/L 139 139 139  Potassium 3.5 - 5.3 mmol/L 4.2 4.0 4.5  Chloride 98 - 110 mmol/L 105 105 107  CO2 20 - 32 mmol/L 27 23 23   Calcium 8.6 - 10.2 mg/dL 9.4 9.0 9.2  Total Protein 6.1 - 8.1 g/dL 7.0 6.6 7.6  Total Bilirubin 0.2 - 1.2 mg/dL 0.8 0.6 0.4  Alkaline Phos 33 - 115 U/L - 30(L) 54  AST 10 - 30 U/L 17 13 24   ALT 6 - 29 U/L 13 13 22     Imaging: No results found.  Speciality Comments: No specialty comments available.    Procedures:  No procedures performed Allergies: Patient has no known allergies.   Assessment / Plan:     Visit Diagnoses: No diagnosis found.    Orders: No orders of the defined types were placed in this encounter.  No orders of the defined types were placed in this encounter.   Face-to-face time spent with patient was *** minutes. 50% of time was spent in counseling and coordination of care.  Follow-Up Instructions: No Follow-up on  file.   Earnestine Mealing, CMA  Note - This record has been created using Editor, commissioning.  Chart creation errors have been sought, but may not always  have been located. Such creation errors do not reflect on  the standard of medical care.

## 2017-09-26 ENCOUNTER — Ambulatory Visit: Payer: Self-pay | Admitting: Rheumatology

## 2017-12-07 NOTE — Progress Notes (Deleted)
Office Visit Note  Patient: Chalise Pe             Date of Birth: 1995/09/16           MRN: [id number]             PCP: Kennyth Arnold, FNP Referring: Kennyth Arnold, FNP Visit Date: 12/19/2017 Occupation: @GUAROCC @    Subjective:  No chief complaint on file.   History of Present Illness: Leah Wise is a 22 y.o. female ***   Activities of Daily Living:  Patient reports morning stiffness for *** {minute/hour:19697}.   Patient {ACTIONS;DENIES/REPORTS:21021675::"Denies"} nocturnal pain.  Difficulty dressing/grooming: {ACTIONS;DENIES/REPORTS:21021675::"Denies"} Difficulty climbing stairs: {ACTIONS;DENIES/REPORTS:21021675::"Denies"} Difficulty getting out of chair: {ACTIONS;DENIES/REPORTS:21021675::"Denies"} Difficulty using hands for taps, buttons, cutlery, and/or writing: {ACTIONS;DENIES/REPORTS:21021675::"Denies"}   No Rheumatology ROS completed.   PMFS History:  Patient Active Problem List   Diagnosis Date Noted  . Chronic migraine 04/20/2017  . Myalgia 12/16/2016  . Rash and other nonspecific skin eruption 12/16/2016  . High risk medication use 12/16/2016  . Rheumatoid arthritis involving multiple sites with positive rheumatoid factor ([place]) 10/26/2016  . Pain in both hands 10/26/2016    Past Medical History:  Diagnosis Date  . Asthma   . Back pain   . Concussion 10/15/2016  . Headache   . Neck pain   . Rheumatoid arthritis ([place])     Family History  Problem Relation Age of Onset  . Healthy Mother   . Healthy Father   . Healthy Sister    No past surgical history on file. Social History   Social History Narrative   Lives at home with roommate.   Right-handed.   Occasional caffeine.     Objective: Vital Signs: There were no vitals taken for this visit.   Physical Exam   Musculoskeletal Exam: ***  CDAI Exam: No CDAI exam completed.    Investigation: No additional findings. CBC Latest Ref Rng & Units 06/28/2017 12/28/2016 10/17/2016    WBC 3.8 - 10.8 Thousand/uL 5.9 14.6(H) 4.4  Hemoglobin 11.7 - 15.5 g/dL 12.3 13.2 14.2  Hematocrit 35.0 - 45.0 % 36.3 38.6 42.2  Platelets 140 - 400 Thousand/uL 261 259 219   CMP Latest Ref Rng & Units 06/28/2017 12/28/2016 10/17/2016  Glucose 65 - 99 mg/dL 72 68 79  BUN 7 - 25 mg/dL 11 9 9   Creatinine 0.50 - 1.10 mg/dL 0.77 0.87 0.73  Sodium 135 - 146 mmol/L 139 139 139  Potassium 3.5 - 5.3 mmol/L 4.2 4.0 4.5  Chloride 98 - 110 mmol/L 105 105 107  CO2 20 - 32 mmol/L 27 23 23   Calcium 8.6 - 10.2 mg/dL 9.4 9.0 9.2  Total Protein 6.1 - 8.1 g/dL 7.0 6.6 7.6  Total Bilirubin 0.2 - 1.2 mg/dL 0.8 0.6 0.4  Alkaline Phos 33 - 115 U/L - 30(L) 54  AST 10 - 30 U/L 17 13 24   ALT 6 - 29 U/L 13 13 22     Imaging: No results found.  Speciality Comments: No specialty comments available.    Procedures:  No procedures performed Allergies: Patient has no known allergies.   Assessment / Plan:     Visit Diagnoses: No diagnosis found.    Orders: No orders of the defined types were placed in this encounter.  No orders of the defined types were placed in this encounter.   Face-to-face time spent with patient was *** minutes. 50% of time was spent in counseling and coordination of care.  Follow-Up Instructions: No follow-ups on  file.   Earnestine Mealing, CMA  Note - This record has been created using Editor, commissioning.  Chart creation errors have been sought, but may not always  have been located. Such creation errors do not reflect on  the standard of medical care.

## 2017-12-19 ENCOUNTER — Ambulatory Visit: Payer: BLUE CROSS/BLUE SHIELD | Admitting: Rheumatology

## 2018-03-30 ENCOUNTER — Other Ambulatory Visit: Payer: Self-pay | Admitting: Nurse Practitioner

## 2018-03-30 DIAGNOSIS — N6320 Unspecified lump in the left breast, unspecified quadrant: Secondary | ICD-10-CM

## 2018-04-05 ENCOUNTER — Other Ambulatory Visit: Payer: Self-pay | Admitting: Nurse Practitioner

## 2018-04-05 ENCOUNTER — Ambulatory Visit
Admission: RE | Admit: 2018-04-05 | Discharge: 2018-04-05 | Disposition: A | Discharge status: Home / Self Care | Payer: BLUE CROSS/BLUE SHIELD | Source: Ambulatory Visit | Attending: Nurse Practitioner | Admitting: Nurse Practitioner

## 2018-04-05 ENCOUNTER — Other Ambulatory Visit: Payer: BLUE CROSS/BLUE SHIELD

## 2018-04-05 DIAGNOSIS — N6320 Unspecified lump in the left breast, unspecified quadrant: Secondary | ICD-10-CM

## 2018-04-10 ENCOUNTER — Ambulatory Visit
Admission: RE | Admit: 2018-04-10 | Discharge: 2018-04-10 | Disposition: A | Discharge status: Home / Self Care | Payer: BLUE CROSS/BLUE SHIELD | Source: Ambulatory Visit | Attending: Nurse Practitioner | Admitting: Nurse Practitioner

## 2018-04-10 DIAGNOSIS — N6320 Unspecified lump in the left breast, unspecified quadrant: Secondary | ICD-10-CM

## 2018-04-11 ENCOUNTER — Other Ambulatory Visit: Payer: Self-pay | Admitting: Nurse Practitioner

## 2018-04-11 ENCOUNTER — Ambulatory Visit
Admission: RE | Admit: 2018-04-11 | Discharge: 2018-04-11 | Disposition: A | Discharge status: Home / Self Care | Payer: BLUE CROSS/BLUE SHIELD | Source: Ambulatory Visit | Attending: Nurse Practitioner | Admitting: Nurse Practitioner

## 2018-04-11 DIAGNOSIS — N63 Unspecified lump in unspecified breast: Secondary | ICD-10-CM

## 2018-04-16 ENCOUNTER — Other Ambulatory Visit: Payer: BLUE CROSS/BLUE SHIELD

## 2018-04-24 ENCOUNTER — Other Ambulatory Visit: Payer: Self-pay | Admitting: Surgery

## 2018-04-27 ENCOUNTER — Encounter (HOSPITAL_COMMUNITY): Payer: Self-pay | Admitting: *Deleted

## 2018-04-27 ENCOUNTER — Other Ambulatory Visit: Payer: Self-pay

## 2018-04-27 NOTE — Progress Notes (Signed)
Ms Leah Wise has no c/o chest pain or shortness of breath.  I instructed patient on clear liquids until 0430.  Patient verbalized understanding.

## 2018-04-29 NOTE — Anesthesia Preprocedure Evaluation (Addendum)
Anesthesia Evaluation  Patient identified by MRN, date of birth, ID band Patient awake    Reviewed: Allergy & Precautions, NPO status , Patient's Chart, lab work & pertinent test results  Airway Mallampati: II  TM Distance: >3 FB Neck ROM: Full    Dental no notable dental hx. (+) Teeth Intact, Dental Advisory Given   Pulmonary asthma ,    Pulmonary exam normal breath sounds clear to auscultation       Cardiovascular Exercise Tolerance: Good negative cardio ROS Normal cardiovascular exam Rhythm:Regular Rate:Normal     Neuro/Psych negative neurological ROS  negative psych ROS   GI/Hepatic negative GI ROS, Neg liver ROS,   Endo/Other    Renal/GU      Musculoskeletal   Abdominal   Peds  Hematology   Anesthesia Other Findings   Reproductive/Obstetrics negative OB ROS                            Anesthesia Physical Anesthesia Plan  ASA: II  Anesthesia Plan: General   Post-op Pain Management:    Induction: Intravenous  PONV Risk Score and Plan: 3 and Treatment may vary due to age or medical condition, Ondansetron, Dexamethasone and Scopolamine patch - Pre-op  Airway Management Planned: LMA  Additional Equipment:   Intra-op Plan:   Post-operative Plan:   Informed Consent: I have reviewed the patients History and Physical, chart, labs and discussed the procedure including the risks, benefits and alternatives for the proposed anesthesia with the patient or authorized representative who has indicated his/her understanding and acceptance.   Dental advisory given  Plan Discussed with:   Anesthesia Plan Comments:         Anesthesia Quick Evaluation

## 2018-04-29 NOTE — H&P (Signed)
Leah Wise Documented: 04/24/2018 2:29 PM Location: [facility] Patient #: [id number] DOB: 1996-07-04 Single / Language: Cleophus Molt / Race: Black or African American Female   History of Present Illness (Remmy Crass A. Ninfa Linden MD; 04/24/2018 2:46 PM) The patient is a 22 year old female who presents with a breast mass. This is a pleasant 22 year old female referred by Dr. 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 The patient developed a palpable mass in the left breast several months ago. She had mammograms and ultrasound. 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 The left breast mass was present 10 cm from the nipple in the upper outer quadrant at the 2 o'clock position. She underwent a stereotactic biopsy of this showing a granular cell tumor. Surgical excision has been recommended. She has no previous history of breast problems. There is no family history of breast cancer. She does have rheumatoid arthritis and asthma but is otherwise healthy.   Past Surgical History Malachi Bonds, CMA; 04/24/2018 2:29 PM) Oral Surgery   Diagnostic Studies History Malachi Bonds, [place]; 04/24/2018 2:29 PM) Colonoscopy  never Mammogram  within last year Pap Smear  1-5 years ago  Allergies Malachi Bonds, CMA; 04/24/2018 2:30 PM) No Known Drug Allergies [04/24/2018]:  Medication History Malachi Bonds, CMA; 04/24/2018 2:31 PM) Flexeril (10MG  Tablet, Oral) Active. Hydroxychloroquine Sulfate (200MG  Tablet, Oral) Active. Tri-Sprintec (0.18/0.215/0.25MG -35 MCG Tablet, Oral) Active. Nortriptyline HCl (10MG  Capsule, Oral) Active. SUMAtriptan Succinate (25MG  Tablet, Oral) Active. Medications Reconciled  Social History Malachi Bonds, CMA; 04/24/2018 2:29 PM) Alcohol use  Occasional alcohol use. Caffeine use  Carbonated beverages. No drug use  Tobacco use  Never smoker.  Family History Malachi Bonds, [place]; 04/24/2018 2:29 PM) First Degree  Relatives  No pertinent family history   Pregnancy / Birth History Malachi Bonds, CMA; 04/24/2018 2:29 PM) Age at menarche  37 years. Contraceptive History  Oral contraceptives. Para  0 Regular periods   Other Problems Malachi Bonds, CMA; 04/24/2018 2:29 PM) Arthritis  Asthma  Oophorectomy     Review of Systems Malachi Bonds CMA; 04/24/2018 2:29 PM) General Not Present- Appetite Loss, Chills, Fatigue, Fever, Night Sweats, Weight Gain and Weight Loss. Skin Not Present- Change in Wart/Mole, Dryness, Hives, Jaundice, New Lesions, Non-Healing Wounds, Rash and Ulcer. HEENT Not Present- Earache, Hearing Loss, Hoarseness, Nose Bleed, Oral Ulcers, Ringing in the Ears, Seasonal Allergies, Sinus Pain, Sore Throat, Visual Disturbances, Wears glasses/contact lenses and Yellow Eyes. Respiratory Not Present- Bloody sputum, Chronic Cough, Difficulty Breathing, Snoring and Wheezing. Breast Present- Breast Mass and Breast Pain. Not Present- Nipple Discharge and Skin Changes. Cardiovascular Not Present- Chest Pain, Difficulty Breathing Lying Down, Leg Cramps, Palpitations, Rapid Heart Rate, Shortness of Breath and Swelling of Extremities. Gastrointestinal Not Present- Abdominal Pain, Bloating, Bloody Stool, Change in Bowel Habits, Chronic diarrhea, Constipation, Difficulty Swallowing, Excessive gas, Gets full quickly at meals, Hemorrhoids, Indigestion, Nausea, Rectal Pain and Vomiting. Female Genitourinary Not Present- Frequency, Nocturia, Painful Urination, Pelvic Pain and Urgency. Musculoskeletal Not Present- Back Pain, Joint Pain, Joint Stiffness, Muscle Pain, Muscle Weakness and Swelling of Extremities. Neurological Not Present- Decreased Memory, Fainting, Headaches, Numbness, Seizures, Tingling, Tremor, Trouble walking and Weakness. Psychiatric Not Present- Anxiety, Bipolar, Change in Sleep Pattern, Depression, Fearful and Frequent crying. Endocrine Not Present- Cold Intolerance, Excessive  Hunger, Hair Changes, Heat Intolerance, Hot flashes and New Diabetes. Hematology Not Present- Blood Thinners, Easy Bruising, Excessive bleeding, Gland problems, HIV and Persistent Infections.  Vitals (Chemira Jones CMA; 04/24/2018 2:30 PM)  04/24/2018 2:29 PM Weight: 107.4 lb Height: 63in Body Surface Area: 1.48 m Body Mass Index: 19.02 kg/m        Physical Exam (Zyen Triggs A. Ninfa Linden MD; 04/24/2018 2:47 PM) General Mental Status-Alert. General Appearance-Consistent with stated age. Hydration-Well hydrated. Voice-Normal.  Head and Neck Head-normocephalic, atraumatic with no lesions or palpable masses. Trachea-midline. Thyroid Gland Characteristics - normal size and consistency.  Eye Eyeball - Bilateral-Extraocular movements intact. Sclera/Conjunctiva - Bilateral-No scleral icterus.  Chest and Lung Exam Chest and lung exam reveals -quiet, even and easy respiratory effort with no use of accessory muscles and on auscultation, normal breath sounds, no adventitious sounds and normal vocal resonance. Inspection Chest Wall - Normal. Back - normal.  Breast Breast - Left-Symmetric, Non Tender, No Biopsy scars, no Dimpling, No Inflammation, No Lumpectomy scars, No Mastectomy scars, No Peau d' Orange. Breast - Right-Symmetric, Non Tender, No Biopsy scars, no Dimpling, No Inflammation, No Lumpectomy scars, No Mastectomy scars, No Peau d' Orange. Note: There is a smooth, mobile mass at 2 o'clock position of the left breast 10 cm from the nipple near the axilla. There are no skin changes.   Cardiovascular Cardiovascular examination reveals -normal heart sounds, regular rate and rhythm with no murmurs and normal pedal pulses bilaterally.  Abdomen - Did not examine.  Neurologic - Did not examine.  Musculoskeletal Normal Exam - Left-Upper Extremity Strength Normal and Lower Extremity Strength Normal. Normal Exam - Right-Upper Extremity Strength Normal  and Lower Extremity Strength Normal.  Lymphatic Head & Neck  General Head & Neck Lymphatics: Bilateral - Description - Normal. Axillary  General Axillary Region: Bilateral - Description - Normal. Tenderness - Non Tender. Femoral & Inguinal - Did not examine.    Assessment & Plan (Jada Fass A. 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 I gave her a copy of the pathology results. I discussed this with the patient and her family. A left breast lumpectomy is recommended for complete removal of the tumor and for complete histologic evaluation throughout any invasive cancer. I discussed the surgical procedure in detail. I discussed the risk of surgery which includes but is not limited to bleeding, infection, the need for further surgical procedures, cardiopulmonary she is, DVT, postoperative recovery, etc. They understand and wish to proceed with surgery

## 2018-04-30 ENCOUNTER — Ambulatory Visit (HOSPITAL_COMMUNITY)
Admission: RE | Admit: 2018-04-30 | Discharge: 2018-04-30 | Disposition: A | Discharge status: Home / Self Care | Payer: BLUE CROSS/BLUE SHIELD | Source: Ambulatory Visit | Attending: Surgery | Admitting: Surgery

## 2018-04-30 ENCOUNTER — Encounter (HOSPITAL_COMMUNITY): Payer: Self-pay | Admitting: Surgery

## 2018-04-30 ENCOUNTER — Encounter (HOSPITAL_COMMUNITY)
Admission: RE | Disposition: A | Discharge status: Home / Self Care | Payer: Self-pay | Source: Ambulatory Visit | Attending: Surgery

## 2018-04-30 ENCOUNTER — Other Ambulatory Visit: Payer: Self-pay

## 2018-04-30 ENCOUNTER — Ambulatory Visit (HOSPITAL_COMMUNITY): Payer: BLUE CROSS/BLUE SHIELD | Admitting: Anesthesiology

## 2018-04-30 DIAGNOSIS — D242 Benign neoplasm of left breast: Secondary | ICD-10-CM | POA: Diagnosis not present

## 2018-04-30 DIAGNOSIS — Z79899 Other long term (current) drug therapy: Secondary | ICD-10-CM | POA: Insufficient documentation

## 2018-04-30 DIAGNOSIS — M1990 Unspecified osteoarthritis, unspecified site: Secondary | ICD-10-CM | POA: Diagnosis not present

## 2018-04-30 DIAGNOSIS — Z90721 Acquired absence of ovaries, unilateral: Secondary | ICD-10-CM | POA: Insufficient documentation

## 2018-04-30 DIAGNOSIS — N6320 Unspecified lump in the left breast, unspecified quadrant: Secondary | ICD-10-CM | POA: Insufficient documentation

## 2018-04-30 DIAGNOSIS — J45909 Unspecified asthma, uncomplicated: Secondary | ICD-10-CM | POA: Insufficient documentation

## 2018-04-30 HISTORY — PX: BREAST LUMPECTOMY: SHX2

## 2018-04-30 HISTORY — DX: Urinary tract infection, site not specified: N39.0

## 2018-04-30 LAB — HEMOGLOBIN: HEMOGLOBIN: 12.2 g/dL (ref 12.0–15.0)

## 2018-04-30 LAB — POCT PREGNANCY, URINE: Preg Test, Ur: NEGATIVE

## 2018-04-30 SURGERY — BREAST LUMPECTOMY
Anesthesia: General | Site: Breast | Laterality: Left

## 2018-04-30 MED ORDER — CHLORHEXIDINE GLUCONATE CLOTH 2 % EX PADS: 6.0000 | MEDICATED_PAD | Freq: Once | CUTANEOUS | Status: DC

## 2018-04-30 MED ORDER — PROPOFOL 10 MG/ML IV BOLUS
INTRAVENOUS | Status: AC
  Filled 2018-04-30: qty 20

## 2018-04-30 MED ORDER — DEXAMETHASONE SODIUM PHOSPHATE 10 MG/ML IJ SOLN
INTRAMUSCULAR | Status: DC | PRN
  Administered 2018-04-30: 10 mg via INTRAVENOUS

## 2018-04-30 MED ORDER — FENTANYL CITRATE (PF) 250 MCG/5ML IJ SOLN
INTRAMUSCULAR | Status: AC
  Filled 2018-04-30: qty 5

## 2018-04-30 MED ORDER — HYDROMORPHONE HCL 1 MG/ML IJ SOLN: 0.2500 mg | INTRAMUSCULAR | Status: DC | PRN

## 2018-04-30 MED ORDER — BUPIVACAINE-EPINEPHRINE 0.25% -1:200000 IJ SOLN
INTRAMUSCULAR | Status: DC | PRN
  Administered 2018-04-30: 11 mL

## 2018-04-30 MED ORDER — FENTANYL CITRATE (PF) 100 MCG/2ML IJ SOLN
INTRAMUSCULAR | Status: DC | PRN
  Administered 2018-04-30: 50 ug via INTRAVENOUS

## 2018-04-30 MED ORDER — PROMETHAZINE HCL 25 MG/ML IJ SOLN: 6.2500 mg | INTRAMUSCULAR | Status: DC | PRN

## 2018-04-30 MED ORDER — CEFAZOLIN SODIUM-DEXTROSE 2-4 GM/100ML-% IV SOLN
2.0000 g | INTRAVENOUS | Status: AC
  Administered 2018-04-30: 2 g via INTRAVENOUS
  Filled 2018-04-30: qty 100

## 2018-04-30 MED ORDER — GABAPENTIN 300 MG PO CAPS
300.0000 mg | ORAL_CAPSULE | ORAL | Status: AC
  Administered 2018-04-30: 300 mg via ORAL
  Filled 2018-04-30: qty 1

## 2018-04-30 MED ORDER — CELECOXIB 200 MG PO CAPS
200.0000 mg | ORAL_CAPSULE | ORAL | Status: AC
  Administered 2018-04-30: 200 mg via ORAL
  Filled 2018-04-30: qty 1

## 2018-04-30 MED ORDER — 0.9 % SODIUM CHLORIDE (POUR BTL) OPTIME
TOPICAL | Status: DC | PRN
  Administered 2018-04-30: 1000 mL

## 2018-04-30 MED ORDER — SCOPOLAMINE 1 MG/3DAYS TD PT72
1.0000 | MEDICATED_PATCH | TRANSDERMAL | Status: DC
  Administered 2018-04-30: 1.5 mg via TRANSDERMAL
  Filled 2018-04-30: qty 1

## 2018-04-30 MED ORDER — HYDROCODONE-ACETAMINOPHEN 7.5-325 MG PO TABS: 1.0000 | ORAL_TABLET | Freq: Once | ORAL | Status: DC | PRN

## 2018-04-30 MED ORDER — PROPOFOL 10 MG/ML IV BOLUS
INTRAVENOUS | Status: DC | PRN
  Administered 2018-04-30: 150 mg via INTRAVENOUS

## 2018-04-30 MED ORDER — LIDOCAINE 2% (20 MG/ML) 5 ML SYRINGE
INTRAMUSCULAR | Status: DC | PRN
  Administered 2018-04-30: 100 mg via INTRAVENOUS

## 2018-04-30 MED ORDER — MIDAZOLAM HCL 5 MG/5ML IJ SOLN
INTRAMUSCULAR | Status: DC | PRN
  Administered 2018-04-30: 2 mg via INTRAVENOUS

## 2018-04-30 MED ORDER — ACETAMINOPHEN 500 MG PO TABS
1000.0000 mg | ORAL_TABLET | ORAL | Status: AC
  Administered 2018-04-30: 1000 mg via ORAL
  Filled 2018-04-30: qty 2

## 2018-04-30 MED ORDER — BUPIVACAINE-EPINEPHRINE (PF) 0.25% -1:200000 IJ SOLN
INTRAMUSCULAR | Status: AC
  Filled 2018-04-30: qty 30

## 2018-04-30 MED ORDER — ONDANSETRON HCL 4 MG/2ML IJ SOLN
INTRAMUSCULAR | Status: DC | PRN
  Administered 2018-04-30: 4 mg via INTRAVENOUS

## 2018-04-30 MED ORDER — OXYCODONE HCL 5 MG PO TABS: 5.0000 mg | ORAL_TABLET | Freq: Four times a day (QID) | ORAL | 0 refills | Status: AC | PRN

## 2018-04-30 MED ORDER — MEPERIDINE HCL 50 MG/ML IJ SOLN: 6.2500 mg | INTRAMUSCULAR | Status: DC | PRN

## 2018-04-30 MED ORDER — LACTATED RINGERS IV SOLN
INTRAVENOUS | Status: DC | PRN
  Administered 2018-04-30: 07:00:00 via INTRAVENOUS

## 2018-04-30 MED ORDER — MIDAZOLAM HCL 2 MG/2ML IJ SOLN
INTRAMUSCULAR | Status: AC
  Filled 2018-04-30: qty 2

## 2018-04-30 MED ORDER — ACETAMINOPHEN 10 MG/ML IV SOLN: 1000.0000 mg | Freq: Once | INTRAVENOUS | Status: DC | PRN

## 2018-04-30 SURGICAL SUPPLY — 39 items
BINDER BREAST LRG (GAUZE/BANDAGES/DRESSINGS) IMPLANT
BINDER BREAST XLRG (GAUZE/BANDAGES/DRESSINGS) IMPLANT
CANISTER SUCT 3000ML PPV (MISCELLANEOUS) ×3 IMPLANT
CHLORAPREP W/TINT 26ML (MISCELLANEOUS) ×3 IMPLANT
CONT SPEC 4OZ CLIKSEAL STRL BL (MISCELLANEOUS) ×3 IMPLANT
COVER SURGICAL LIGHT HANDLE (MISCELLANEOUS) ×3 IMPLANT
DECANTER SPIKE VIAL GLASS SM (MISCELLANEOUS) ×3 IMPLANT
DERMABOND ADVANCED (GAUZE/BANDAGES/DRESSINGS) ×2
DERMABOND ADVANCED .7 DNX12 (GAUZE/BANDAGES/DRESSINGS) ×1 IMPLANT
DEVICE DUBIN SPECIMEN MAMMOGRA (MISCELLANEOUS) IMPLANT
DRAPE LAPAROTOMY 100X72 PEDS (DRAPES) ×3 IMPLANT
DRAPE UTILITY XL STRL (DRAPES) ×3 IMPLANT
ELECT CAUTERY BLADE 6.4 (BLADE) ×3 IMPLANT
ELECT REM PT RETURN 9FT ADLT (ELECTROSURGICAL) ×3
ELECTRODE REM PT RTRN 9FT ADLT (ELECTROSURGICAL) ×1 IMPLANT
GAUZE 4X4 16PLY RFD (DISPOSABLE) ×3 IMPLANT
GLOVE SURG SIGNA 7.5 PF LTX (GLOVE) ×3 IMPLANT
GOWN STRL REUS W/ TWL LRG LVL3 (GOWN DISPOSABLE) IMPLANT
GOWN STRL REUS W/ TWL XL LVL3 (GOWN DISPOSABLE) ×2 IMPLANT
GOWN STRL REUS W/TWL LRG LVL3 (GOWN DISPOSABLE)
GOWN STRL REUS W/TWL XL LVL3 (GOWN DISPOSABLE) ×4
KIT BASIN OR (CUSTOM PROCEDURE TRAY) ×3 IMPLANT
KIT MARKER MARGIN INK (KITS) IMPLANT
KIT TURNOVER KIT B (KITS) ×3 IMPLANT
NEEDLE HYPO 25GX1X1/2 BEV (NEEDLE) ×3 IMPLANT
NS IRRIG 1000ML POUR BTL (IV SOLUTION) ×3 IMPLANT
PACK SURGICAL SETUP 50X90 (CUSTOM PROCEDURE TRAY) ×3 IMPLANT
PAD ARMBOARD 7.5X6 YLW CONV (MISCELLANEOUS) ×3 IMPLANT
PENCIL BUTTON HOLSTER BLD 10FT (ELECTRODE) ×3 IMPLANT
SUT MNCRL AB 4-0 PS2 18 (SUTURE) ×3 IMPLANT
SUT VIC AB 3-0 SH 27 (SUTURE) ×2
SUT VIC AB 3-0 SH 27X BRD (SUTURE) ×1 IMPLANT
SYR BULB 3OZ (MISCELLANEOUS) ×3 IMPLANT
SYR CONTROL 10ML LL (SYRINGE) ×3 IMPLANT
TOWEL OR 17X24 6PK STRL BLUE (TOWEL DISPOSABLE) ×3 IMPLANT
TOWEL OR 17X26 10 PK STRL BLUE (TOWEL DISPOSABLE) ×3 IMPLANT
TUBE CONNECTING 12'X1/4 (SUCTIONS) ×1
TUBE CONNECTING 12X1/4 (SUCTIONS) ×2 IMPLANT
YANKAUER SUCT BULB TIP NO VENT (SUCTIONS) ×3 IMPLANT

## 2018-04-30 NOTE — Interval H&P Note (Signed)
History and Physical Interval Note: no change in H and P  04/30/2018 7:05 AM  Leah Wise  has presented today for surgery, with the diagnosis of LEFT BREAST GRANULAR CELL TUMOR  The various methods of treatment have been discussed with the patient and family. After consideration of risks, benefits and other options for treatment, the patient has consented to  Procedure(s): LEFT BREAST LUMPECTOMY (Left) as a surgical intervention .  The patient's history has been reviewed, patient examined, no change in status, stable for surgery.  I have reviewed the patient's chart and labs.  Questions were answered to the patient's satisfaction.     Kanitra Purifoy A

## 2018-04-30 NOTE — Anesthesia Postprocedure Evaluation (Signed)
Anesthesia Post Note  Patient: Leah Wise  Procedure(s) Performed: LEFT BREAST LUMPECTOMY (Left Breast)     Patient location during evaluation: PACU Anesthesia Type: General Level of consciousness: awake and alert Pain management: pain level controlled Vital Signs Assessment: post-procedure vital signs reviewed and stable Respiratory status: spontaneous breathing, nonlabored ventilation, respiratory function stable and patient connected to nasal cannula oxygen Cardiovascular status: blood pressure returned to baseline and stable Postop Assessment: no apparent nausea or vomiting Anesthetic complications: no    Last Vitals:  Vitals:   04/30/18 0900 04/30/18 0905  BP:  108/73  Pulse: 71 70  Resp: 19 17  Temp: (!) 36.4 C   SpO2: 100% 100%    Last Pain:  Vitals:   04/30/18 0905  TempSrc:   PainSc: 0-No pain                 Barnet Glasgow

## 2018-04-30 NOTE — Op Note (Addendum)
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  Biopsy confirmed Wise granular cell tumor.  The decision was made to proceed with Wise lumpectomy  Procedure: The patient was brought to the operating room and identified as correct patient.  She is placed upon in the operating room table and general anesthesia was induced.  Her left breast was then prepped and draped in the usual sterile fashion.  I anesthetized the skin just lateral to the breast near the axilla with Marcaine.  The mass was at the 2 o'clock position of the breast 10 cm from the nipple.  It was easily palpable.  I made incision with Wise scalpel and then tunneled medially into the breast tissue with Wise hemostat.  I was finally able to identify the mass and grasped with Wise clamp and then excised in its entirety.  I achieved hemostasis with the cautery.  I anesthetized the incision further with Marcaine.  I then closed the subcutaneous tissue with interrupted 3-0 Vicryl sutures and closed the skin with Wise running 4-0 Monocryl.  Dermabond was then applied.  The patient tolerated the procedure well.  All the counts were correct at the end of the procedure.  The patient was then extubated in the operating room and taken in Wise stable condition to the recovery room.          Leah Wise   Date: 04/30/2018  Time: 8:05 AM

## 2018-04-30 NOTE — Discharge Instructions (Signed)
[facility] Office Phone Number [phone]  BREAST BIOPSY/ PARTIAL MASTECTOMY: POST OP INSTRUCTIONS  Always review your discharge instruction sheet given to you by the facility where your surgery was performed.  IF YOU HAVE DISABILITY OR FAMILY LEAVE FORMS, YOU MUST BRING THEM TO THE OFFICE FOR PROCESSING.  DO NOT GIVE THEM TO YOUR DOCTOR.  1. A prescription for pain medication may be given to you upon discharge.  Take your pain medication as prescribed, if needed.  If narcotic pain medicine is not needed, then you may take acetaminophen (Tylenol) or ibuprofen (Advil) as needed. 2. Take your usually prescribed medications unless otherwise directed 3. If you need a refill on your pain medication, please contact your pharmacy.  They will contact our office to request authorization.  Prescriptions will not be filled after 5pm or on week-ends. 4. You should eat very light the first 24 hours after surgery, such as soup, crackers, pudding, etc.  Resume your normal diet the day after surgery. 5. Most patients will experience some swelling and bruising in the breast.  Ice packs and a good support bra will help.  Swelling and bruising can take several days to resolve.  6. It is common to experience some constipation if taking pain medication after surgery.  Increasing fluid intake and taking a stool softener will usually help or prevent this problem from occurring.  A mild laxative (Milk of Magnesia or Miralax) should be taken according to package directions if there are no bowel movements after 48 hours. 7. Unless discharge instructions indicate otherwise, you may remove your bandages 24-48 hours after surgery, and you may shower at that time.  You may have steri-strips (small skin tapes) in place directly over the incision.  These strips should be left on the skin for 7-10 days.  If your surgeon used skin glue on the incision, you may shower in 24 hours.  The glue will flake off over the  next 2-3 weeks.  Any sutures or staples will be removed at the office during your follow-up visit. 8. ACTIVITIES:  You may resume regular daily activities (gradually increasing) beginning the next day.  Wearing a good support bra or sports bra minimizes pain and swelling.  You may have sexual intercourse when it is comfortable. a. You may drive when you no longer are taking prescription pain medication, you can comfortably wear a seatbelt, and you can safely maneuver your car and apply brakes. b. RETURN TO WORK:  _____AS TOLERATED c. _________________________________________________________________________________ 9. You should see your doctor in the office for a follow-up appointment approximately two weeks after your surgery.  Your doctors nurse will typically make your follow-up appointment when she calls you with your pathology report.  Expect your pathology report 2-3 business days after your surgery.  You may call to check if you do not hear from [place] after three days. 10. OTHER INSTRUCTIONS: __OK TO SHOWER STARTING TOMORROW 11. ICE PACK, TYLENOL, AND IBUPROFEN FOR  PAIN 12. NO VIGOROUS ACTIVITY FOR ONE WEEK 13. _____________________________________________________________________________________________ _____________________________________________________________________________________________________________________________________ _____________________________________________________________________________________________________________________________________ _____________________________________________________________________________________________________________________________________  WHEN TO CALL YOUR DOCTOR: 1. Fever over 101.0 2. Nausea and/or vomiting. 3. Extreme swelling or bruising. 4. Continued bleeding from incision. 5. Increased pain, redness, or drainage from the incision.  The clinic staff is available to answer your questions during regular business hours.  Please  dont hesitate to call and ask to speak to one of the nurses for clinical concerns.  If you have a medical emergency, go to the nearest emergency room  or call 911.  A surgeon from [facility] is always on call at the hospital.  For further questions, please visit [email or web address]

## 2018-04-30 NOTE — Anesthesia Procedure Notes (Signed)
Procedure Name: LMA Insertion Date/Time: 04/30/2018 7:33 AM Performed by: Gaylene Brooks, CRNA Pre-anesthesia Checklist: Patient identified, Emergency Drugs available, Suction available and Patient being monitored Patient Re-evaluated:Patient Re-evaluated prior to induction Oxygen Delivery Method: Circle System Utilized Preoxygenation: Pre-oxygenation with 100% oxygen Induction Type: IV induction Ventilation: Mask ventilation without difficulty LMA: LMA inserted LMA Size: 4.0 Number of attempts: 1 Placement Confirmation: positive ETCO2 Tube secured with: Tape Dental Injury: Teeth and Oropharynx as per pre-operative assessment

## 2018-04-30 NOTE — Transfer of Care (Signed)
Immediate Anesthesia Transfer of Care Note  Patient: Leah Wise  Procedure(s) Performed: LEFT BREAST LUMPECTOMY (Left Breast)  Patient Location: PACU  Anesthesia Type:General  Level of Consciousness: awake, alert , oriented and sedated  Airway & Oxygen Therapy: Patient Spontanous Breathing and Patient connected to nasal cannula oxygen  Post-op Assessment: Report given to RN, Post -op Vital signs reviewed and stable and Patient moving all extremities X 4  Post vital signs: Reviewed and stable  Last Vitals:  Vitals Value Taken Time  BP 112/69 04/30/2018  8:10 AM  Temp    Pulse 83 04/30/2018  8:11 AM  Resp 19 04/30/2018  8:11 AM  SpO2 100 % 04/30/2018  8:11 AM  Vitals shown include unvalidated device data.  Last Pain:  Vitals:   04/30/18 0630  TempSrc:   PainSc: 0-No pain      Patients Stated Pain Goal: 3 (42/39/53 2023)  Complications: No apparent anesthesia complications

## 2018-05-01 ENCOUNTER — Encounter (HOSPITAL_COMMUNITY): Payer: Self-pay | Admitting: Surgery

## 2018-10-11 ENCOUNTER — Other Ambulatory Visit: Payer: BLUE CROSS/BLUE SHIELD

## 2018-12-20 IMAGING — US ULTRASOUND LEFT BREAST LIMITED
1 series · 13 of 14 positions shown · non-contrast
Comparison: Previous exam(s).

ADDENDUM:
I was asked to perform an ultrasound-guided core needle biopsy of a
borderline abnormal left axillary lymph node. Preprocedural
real-time examination of the left axilla demonstrates the lymph node
of question to be located in immediate proximity to several large
axillary arterial and venous branches, and therefore the
ultrasound-guided sampling was deemed unsafe. The scheduled
ultrasound-guided biopsy was canceled.

Recommend correlation to left axillary lymph node sampling, if
deemed clinically necessary.
These findings were discussed with the patient at the time of the
exam.
CLINICAL DATA: Palpable lump in the left breast at 2 o'clock
EXAM:
ULTRASOUND OF THE LEFT BREAST

[Series 1: ultrasound left breast limited · 0.05mm/px · 13 of 14 slices shown]
[im 1/14]
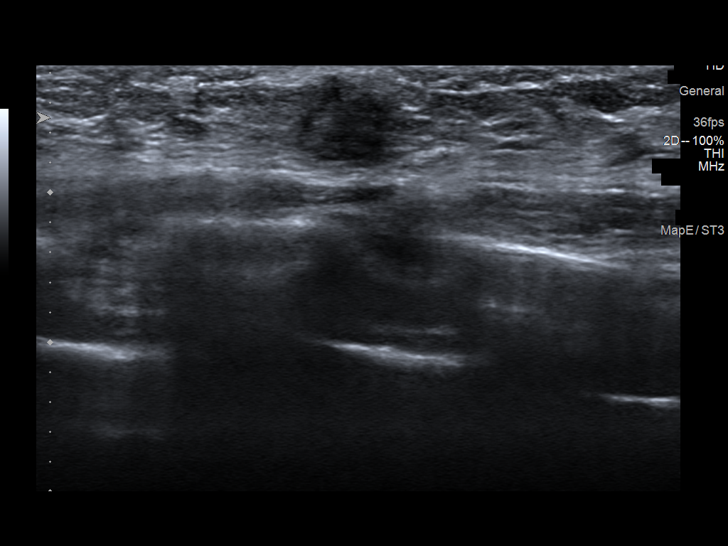
[im 2/14]
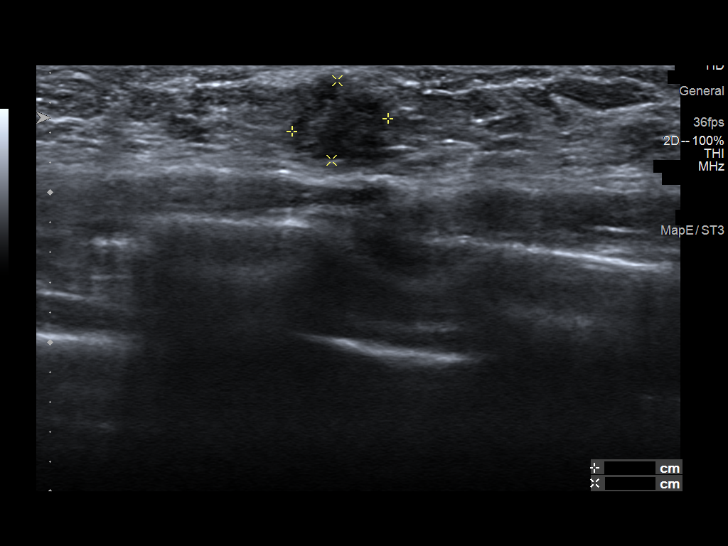
[im 3/14]
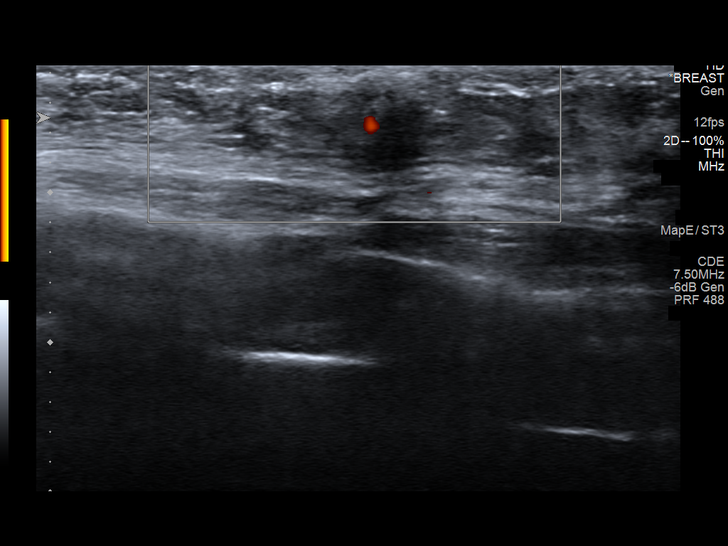
[im 4/14]
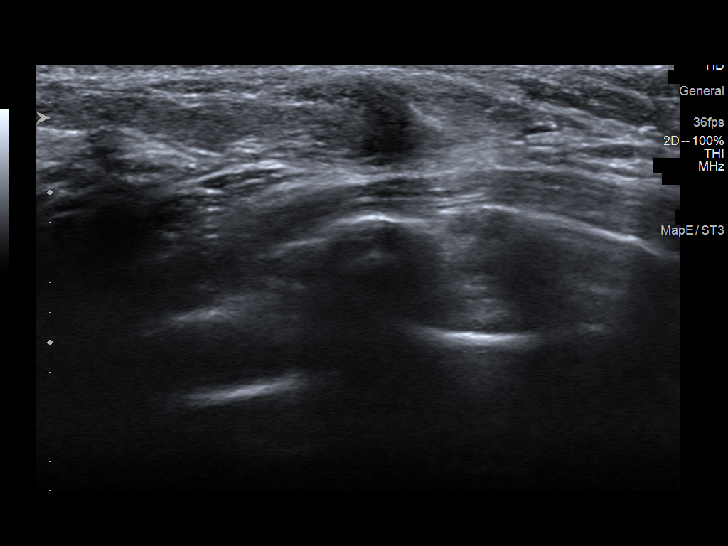
[im 5/14]
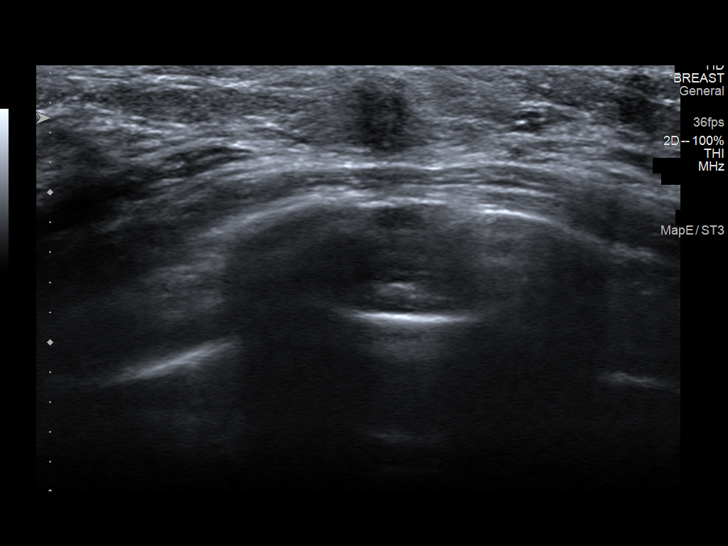
[im 6/14]
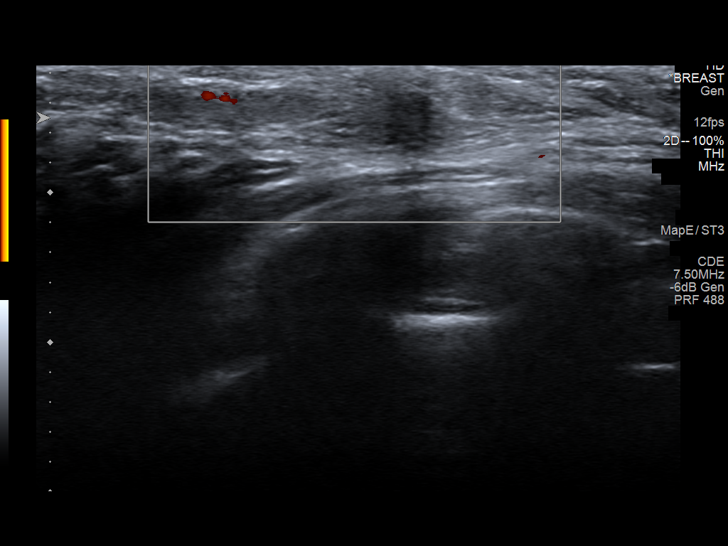
[im 8/14]
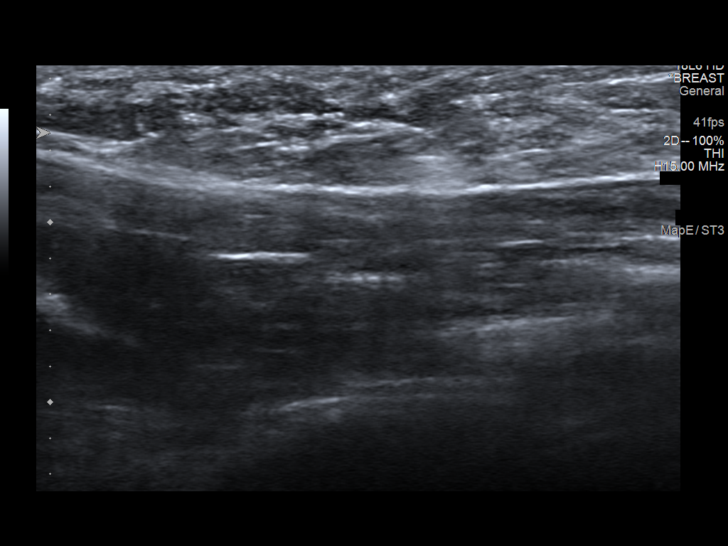
[im 9/14]
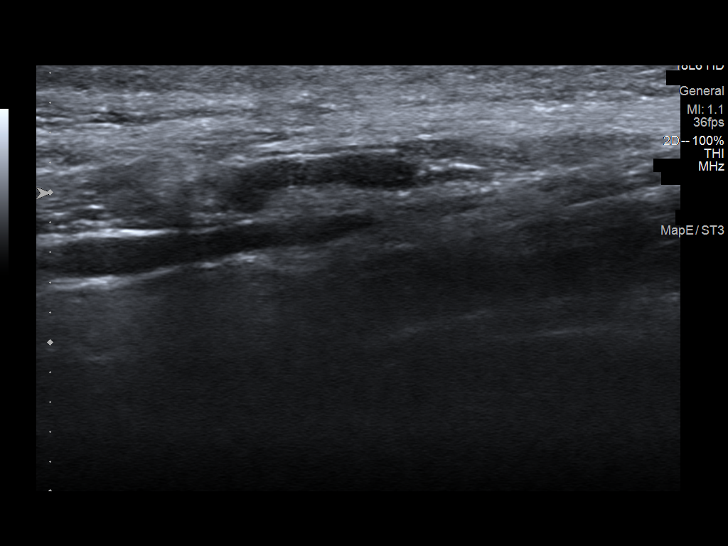
[im 10/14]
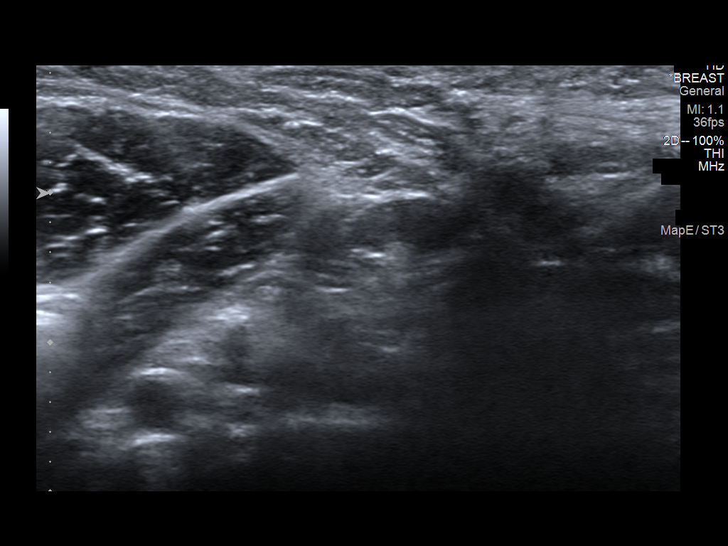
[im 11/14]
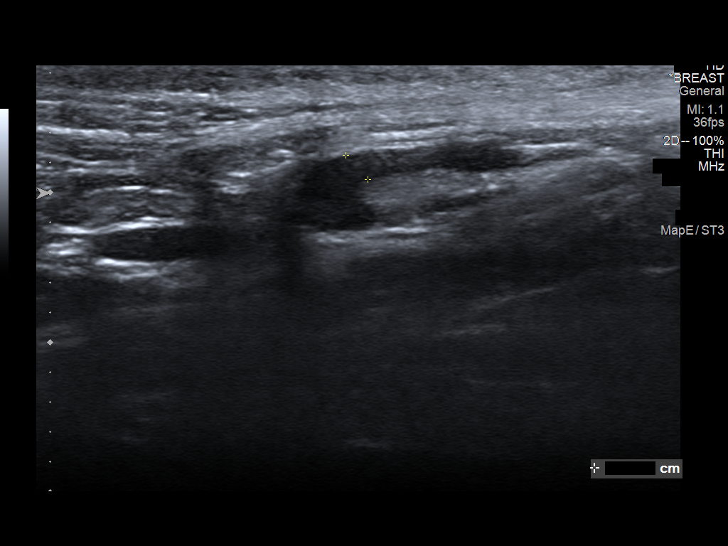
[im 12/14]
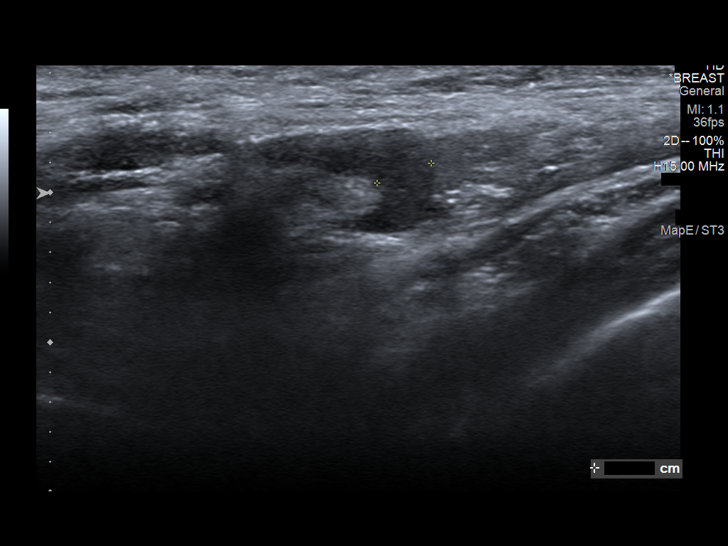
[im 13/14]
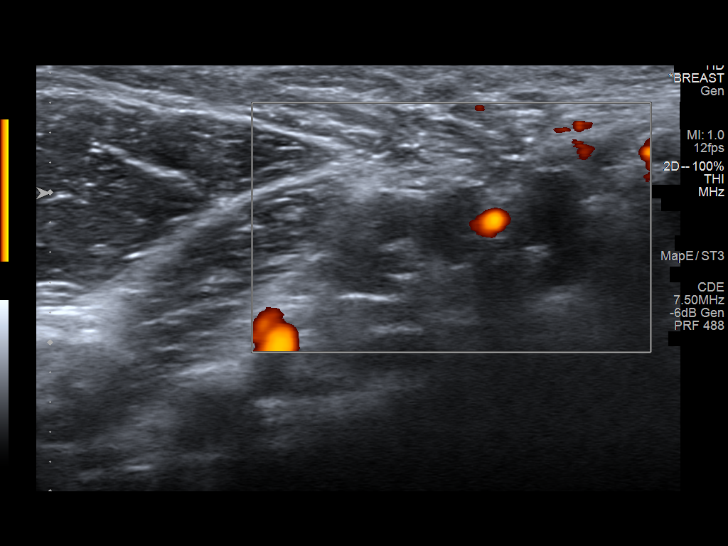
[im 14/14]
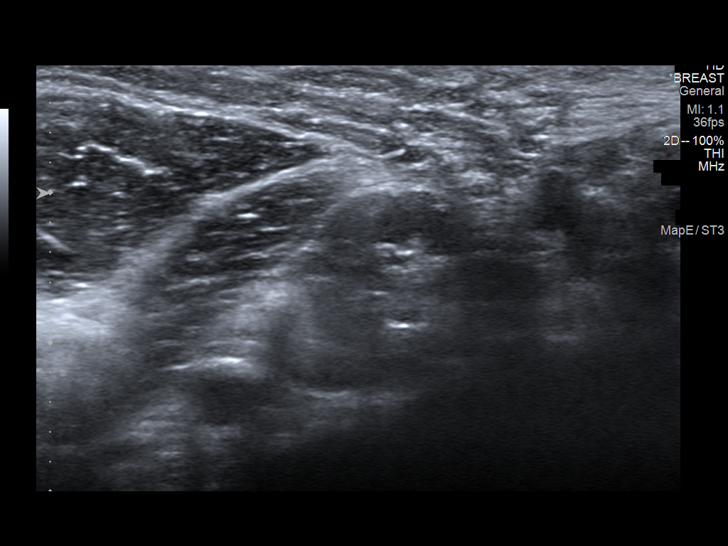

[13 of 14 positions shown; findings below may reference images not displayed]

FINDINGS: On physical exam, and Schmitt-Chornogura size lump is felt at 2 o'clock in the
left breast, 10 cm from the nipple.

Targeted ultrasound is performed, showing a hypoechoic mass in left
breast at 2 o'clock, 10 cm from the nipple measuring 7 x 5 mm. It is
possible this is a lymph node but not certain. The mass is mildly
irregular. There is a borderline lymph node in the left axilla with
a cortex measuring up to 3.8 mm. There is retention of the fatty
hilum.
IMPRESSION: 1. Indeterminate left breast mass.
2. Borderline left axillary node.

RECOMMENDATION:
Recommend biopsy of the indeterminate left breast mass. If the
biopsy is benign, recommend six-month follow-up of the borderline
lymph node. If the biopsy demonstrates malignancy, recommend biopsy
of the left axillary lymph node.

I have discussed the findings and recommendations with the patient.
Results were also provided in writing at the conclusion of the
visit. If applicable, a reminder letter will be sent to the patient
regarding the next appointment.

BI-RADS CATEGORY  4: Suspicious.

## 2018-12-26 IMAGING — MG DIGITAL DIAGNOSTIC BILATERAL MAMMOGRAM WITH TOMO AND CAD
8 series · 8 of 24 positions shown · non-contrast
Comparison: 04/05/2018, 04/10/2018

CLINICAL DATA: Ultrasound-guided core biopsy yesterday of mass in
the LEFT breast two o'clock location shows granular cell tumor.
Patient returns today for diagnostic mammogram of both breasts.

EXAM:
DIGITAL DIAGNOSTIC BILATERAL MAMMOGRAM WITH CAD AND TOMO
ULTRASOUND RIGHT BREAST

[R CC synth-2D]
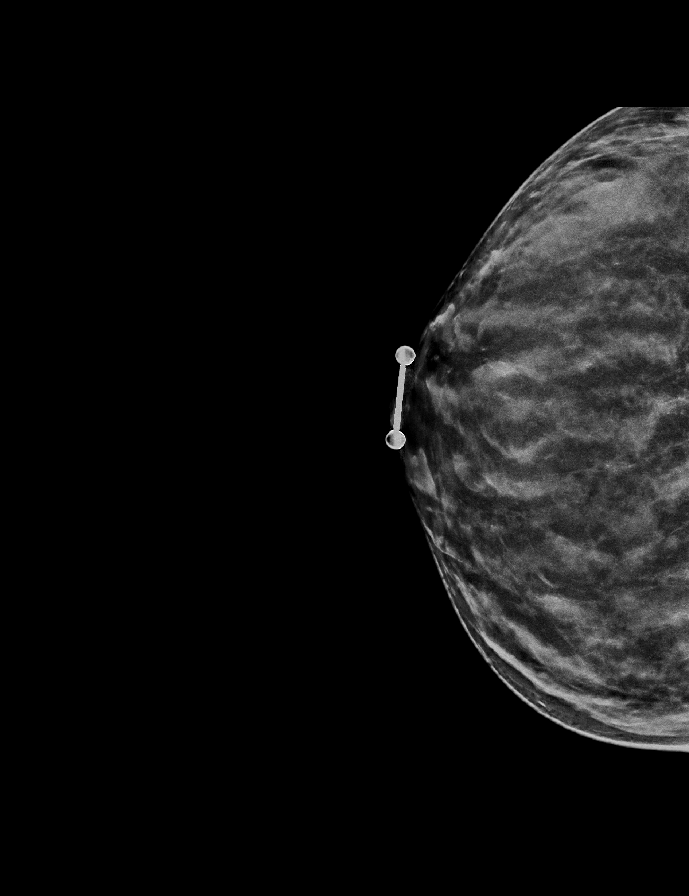

[L MLO synth-2D]
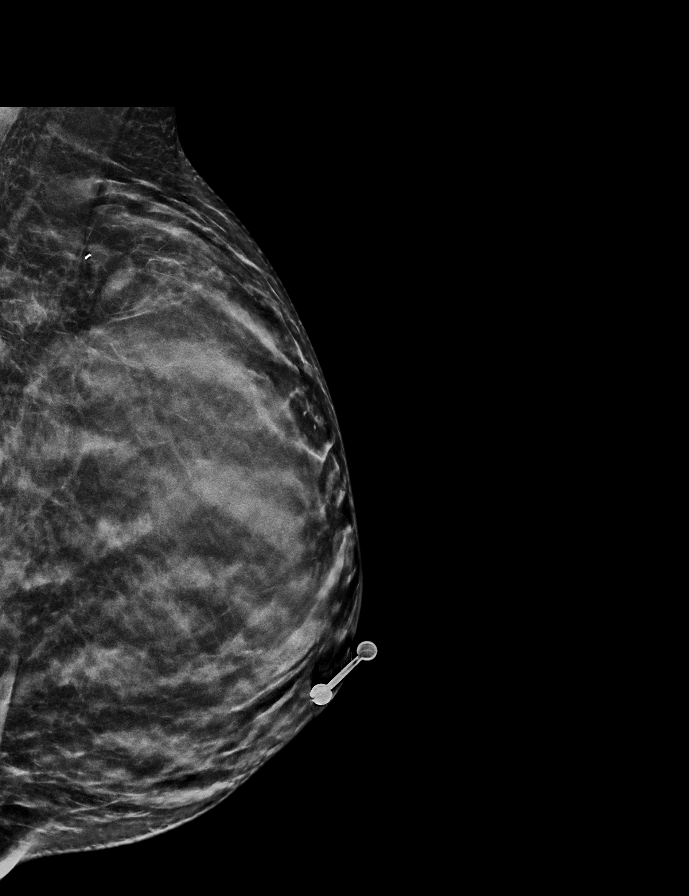

[R MLO synth-2D]
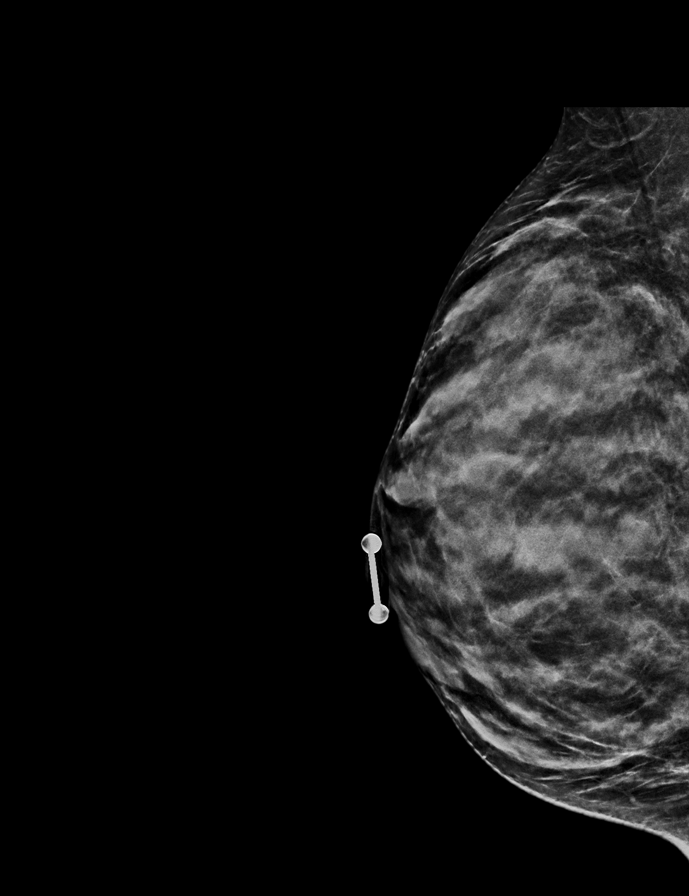

[L CC synth-2D]
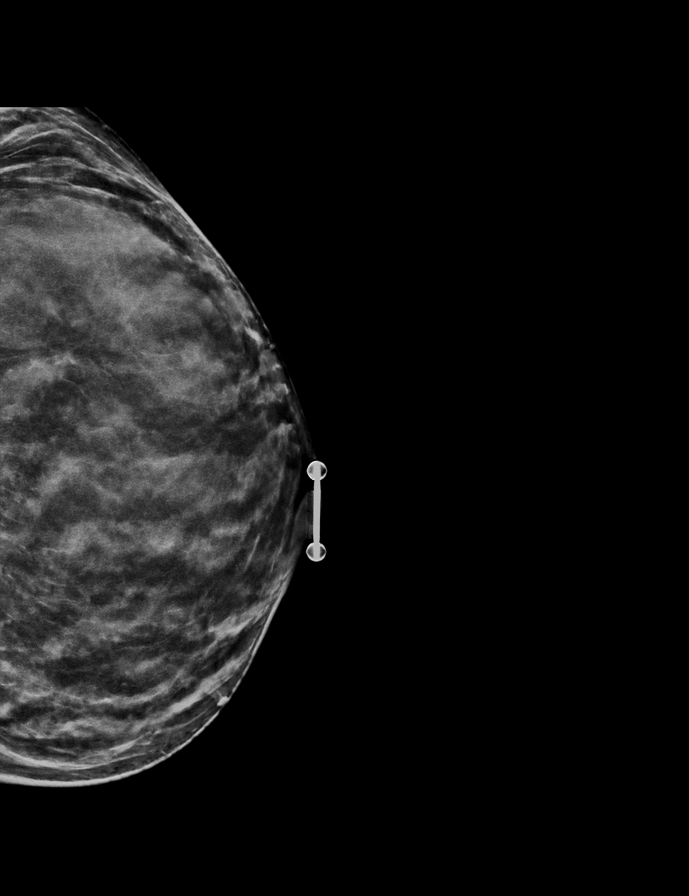

[R CC tomo · tomo slice 28/55.0]
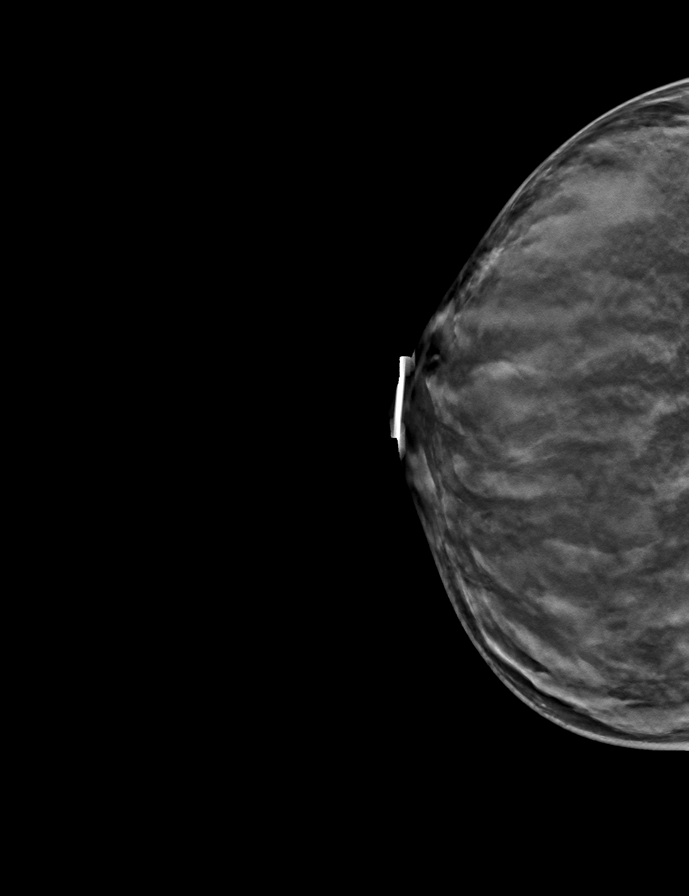

[L MLO tomo · tomo slice 27/52.0]
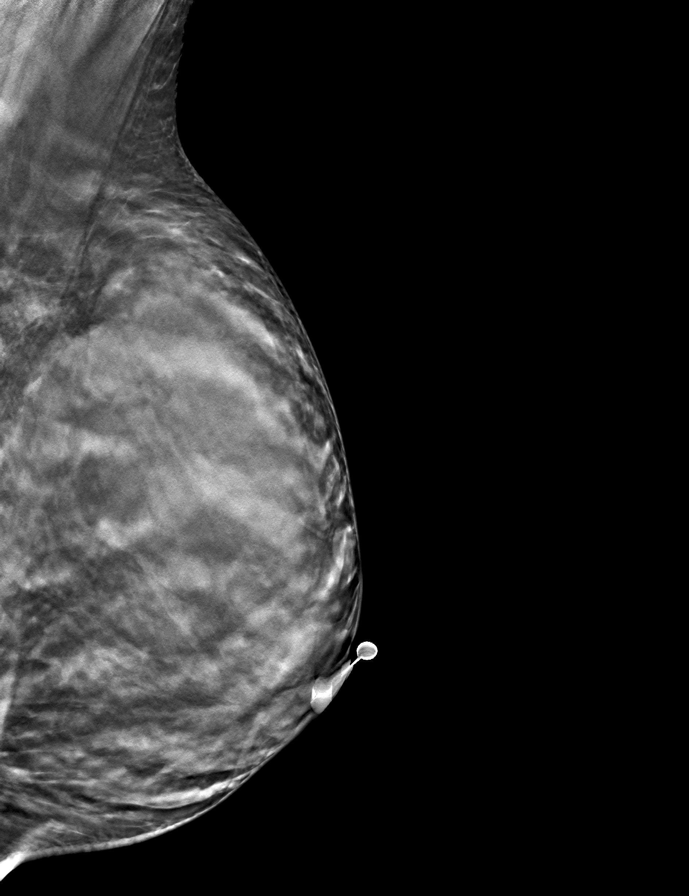

[L CC tomo · tomo slice 31/60.0]
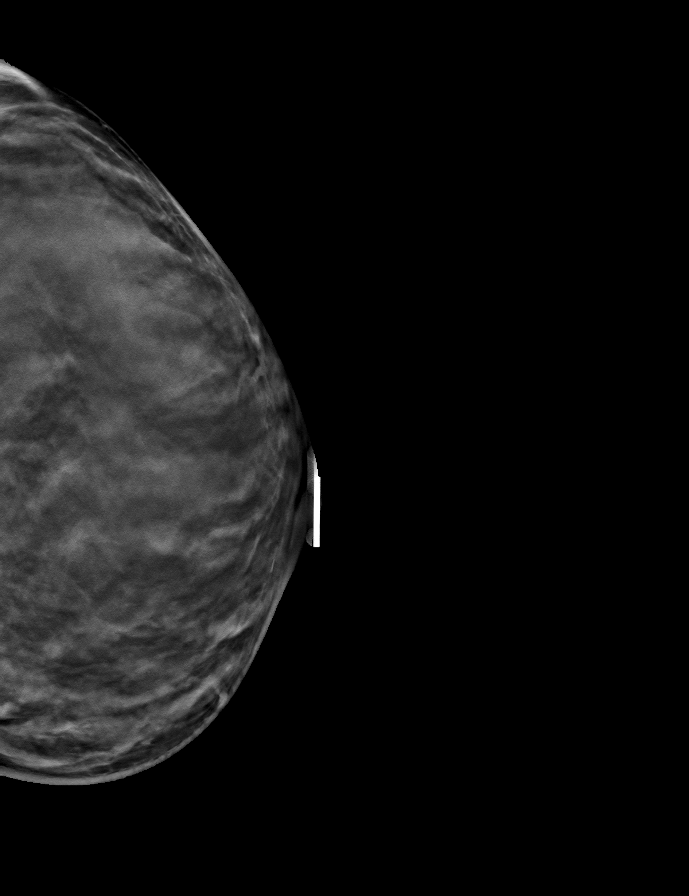

[R MLO tomo · tomo slice 25/50.0]
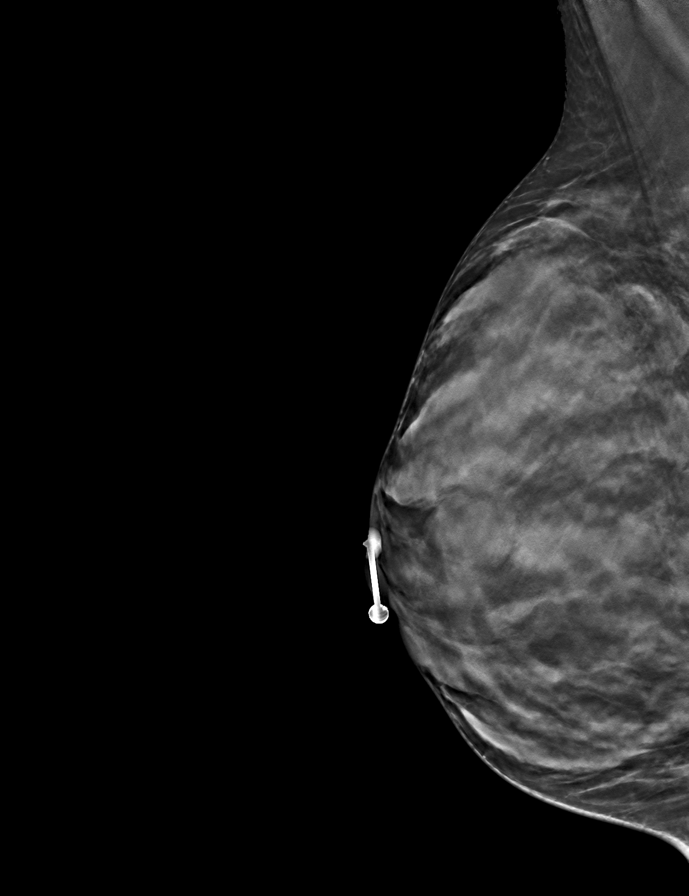

[8 of 24 positions shown; findings below may reference images not displayed]

ACR Breast Density Category d: The breast tissue is extremely dense,
which lowers the sensitivity of mammography.
FINDINGS: Within the retroareolar region of the RIGHT breast there is a
circumscribed oval partially obscured mass. The LEFT breast is
negative.

Mammographic images were processed with CAD.

Targeted ultrasound is performed, showing a circumscribed oval
hypoechoic parallel mass in the 12 o'clock location of the RIGHT
breast 1 centimeter from the nipple which measures 1.6 x 0.7 x
centimeters. Evaluation of the RIGHT axilla is negative for
adenopathy.
IMPRESSION: Probable fibroadenoma in the retroareolar region of the RIGHT breast
warranting follow-up. We discussed management options including
excision, biopsy, and close follow-up. Imaging followup is
recommended at 6, 12, and 24 months to assess stability. The patient
concurs with this plan.

RECOMMENDATION:
RIGHT breast ultrasound is recommended in 6 months.

I have discussed the findings and recommendations with the patient.
Results were also provided in writing at the conclusion of the
visit. If applicable, a reminder letter will be sent to the patient
regarding the next appointment.

BI-RADS CATEGORY  3: Probably benign.

## 2019-06-20 NOTE — Progress Notes (Signed)
Virtual Visit via Video Note  I connected with Leah Wise on 06/24/19 at 10:30 AM EST by a video enabled telemedicine application and verified that I am speaking with the correct person using two identifiers.  Location: Patient: Home  Provider: Clinic  This service was conducted via virtual visit.  Both audio and visual tools were used.  The patient was located at home. I was located in my office.  Consent was obtained prior to the virtual visit and is aware of possible charges through their insurance for this visit.  The patient is an established patient.  Dr. Estanislado Pandy, MD conducted the virtual visit and Hazel Sams, PA-C acted as scribe during the service.  Office staff helped with scheduling follow up visits after the service was conducted.   I discussed the limitations of evaluation and management by telemedicine and the availability of in person appointments. The patient expressed understanding and agreed to proceed.  CC: pain in multiple joints  History of Present Illness: Patient is a 23 year old female with a past medical history of seropositive rheumatoid arthritis and Raynaud's. She has been off of Plaquenil for about 2 years.  She was last seen in the office in December 2018. She is having increased stiffness in both shoulder joints.  She has difficulty raising her arms above her head, especially in the morning. She is also having intercostal chest pain. She has pain and stiffness in both hands and both wrist joints. She has had intermittent swelling in both hands.  She is also having pain in both feet.   She has difficulty bearing weight due to the discomfort. She denies any knee joint pain.  Review of Systems  Constitutional: Negative for fever and malaise/fatigue.  Eyes: Negative for photophobia, pain, discharge and redness.  Respiratory: Negative for cough, shortness of breath and wheezing.   Cardiovascular: Negative for chest pain and palpitations.  Gastrointestinal:  Negative for blood in stool, constipation and diarrhea.  Genitourinary: Negative for dysuria.  Musculoskeletal: Positive for joint pain. Negative for back pain, myalgias and neck pain.       +Morning stiffness +Joint swelling  Skin: Negative for rash.  Neurological: Negative for dizziness and headaches.  Psychiatric/Behavioral: Negative for depression. The patient is not nervous/anxious and does not have insomnia.       Observations/Objective: Physical Exam  Constitutional: She is oriented to person, place, and time and well-developed, well-nourished, and in no distress.  HENT:  Head: Normocephalic and atraumatic.  Eyes: Conjunctivae are normal.  Pulmonary/Chest: Effort normal.  Neurological: She is alert and oriented to person, place, and time.  Psychiatric: Mood, memory, affect and judgment normal.   Patient reports morning stiffness for    several hours.   Patient reports nocturnal pain.  Difficulty dressing/grooming: Denies Difficulty climbing stairs: Reports Difficulty getting out of chair: Reports Difficulty using hands for taps, buttons, cutlery, and/or writing: Reports   Assessment and Plan: Visit Diagnoses: Rheumatoid arthritis involving multiple sites with positive rheumatoid factor (HCC) - Positive RF, positive anti-CCP, positive ANA, positive synovitis initially): She is currently having a rheumatoid arthritis flare.  She is having pain and stiffness in both shoulder joints, both wrist joints, both hands, and both feet. She has intermittent swelling in both hands.  She has been having increased morning stiffness and has had difficulty raising her arms in the morning due to the discomfort and stiffness.  She has been off of Plaquenil for 2 years.  Her last office visit was on 07/17/17.  She would like to restart on Plaquenil 200 mg 1 tablet by mouth twice daily M-F.  She will require updated lab work prior to restarting on [place].  She will also require a baseline PLQ eye exam.  She will come by the office tomorrow to obtain [name] handout, consent form, eye exam form, and lab orders. She will follow up in the office in 1 month.   Patient was counseled on the purpose, proper use, and adverse effects of hydroxychloroquine including nausea/diarrhea, skin rash, headaches, and sun sensitivity.  Discussed importance of annual eye exams while on hydroxychloroquine to monitor to ocular toxicity and discussed importance of frequent laboratory monitoring.  Provided patient with eye exam form for baseline ophthalmologic exam.  Provided patient with educational materials on hydroxychloroquine and answered all questions.  Patient consented to hydroxychloroquine.  Will upload consent in the media tab.    Dose will be Plaquenil 200 mg twice daily Monday through Friday.  Prescription pending lab results.  High risk medication use - PLQ 200 mg by mouth twice a day Monday through Friday. eye exam 07/12/2017. She will require updated lab work and an updated eye exam.    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    Myalgia: She had not had any worsening symptoms of muscle aches or muscle tenderness.  No muscle weakness.   Other medical conditions are listed as follows:   History of migraine  Rash - History of rash-No recurrence   G6PD deficiency ([place]) : I discussed that she would not be able to take sulfasalazine.   Follow Up Instructions: She will follow up in 1 month.    I discussed the assessment and treatment plan with the patient. The patient was provided an opportunity to ask questions and all were answered. The patient agreed with the plan and demonstrated an understanding of the instructions.   The patient was advised to call back or seek an in-person evaluation if the symptoms worsen or if the condition fails to improve as anticipated.  I provided 25 minutes of non-face-to-face time during this encounter.   Leah Neas, PA-C

## 2019-06-24 ENCOUNTER — Telehealth: Payer: Self-pay

## 2019-06-24 ENCOUNTER — Other Ambulatory Visit: Payer: Self-pay

## 2019-06-24 ENCOUNTER — Encounter: Payer: Self-pay | Admitting: Rheumatology

## 2019-06-24 ENCOUNTER — Telehealth (INDEPENDENT_AMBULATORY_CARE_PROVIDER_SITE_OTHER): Payer: Self-pay | Admitting: Rheumatology

## 2019-06-24 DIAGNOSIS — M791 Myalgia, unspecified site: Secondary | ICD-10-CM

## 2019-06-24 DIAGNOSIS — I7300 Raynaud's syndrome without gangrene: Secondary | ICD-10-CM

## 2019-06-24 DIAGNOSIS — D75A Glucose-6-phosphate dehydrogenase (G6PD) deficiency without anemia: Secondary | ICD-10-CM

## 2019-06-24 DIAGNOSIS — M0579 Rheumatoid arthritis with rheumatoid factor of multiple sites without organ or systems involvement: Secondary | ICD-10-CM

## 2019-06-24 DIAGNOSIS — Z8669 Personal history of other diseases of the nervous system and sense organs: Secondary | ICD-10-CM

## 2019-06-24 DIAGNOSIS — Z79899 Other long term (current) drug therapy: Secondary | ICD-10-CM

## 2019-06-24 MED ORDER — PREDNISONE 5 MG PO TABS: ORAL_TABLET | ORAL | 0 refills | Status: AC

## 2019-06-24 NOTE — Addendum Note (Signed)
Addended by: Francis Gaines C on: 06/24/2019 04:00 PM   Modules accepted: Orders

## 2019-06-24 NOTE — Telephone Encounter (Addendum)
Pending lab results/consent, please send in prescription for PLQ.

## 2019-06-24 NOTE — Patient Instructions (Signed)
Standing Labs We placed an order today for your standing lab work.    Please come back and get your standing labs in 1 month and then every 3 months.   We have open lab daily Monday through Thursday from 8:30-12:30 PM and 1:30-4:30 PM and Friday from 8:30-12:30 PM and 1:30-4:00 PM at the office of Dr. Bo Merino.   You may experience shorter wait times on Monday and Friday afternoons. The office is located at [address], [place], [place], [place] [address] No appointment is necessary.   Labs are drawn by [facility].  You may receive a bill from [facility] for your lab work.  If you wish to have your labs drawn at another location, please call the office 24 hours in advance to send orders.  If you have any questions regarding directions or hours of operation,  please call [phone].   Just as a reminder please drink plenty of water prior to coming for your lab work. Thanks!     Hydroxychloroquine tablets What is this medicine? HYDROXYCHLOROQUINE (hye drox ee KLOR oh kwin) is used to treat rheumatoid arthritis and systemic lupus erythematosus. It is also used to treat malaria. This medicine may be used for other purposes; ask your health care provider or pharmacist if you have questions. COMMON BRAND NAME(S): Plaquenil, Quineprox What should I tell my health care provider before I take this medicine? They need to know if you have any of these conditions:  diabetes  eye disease, vision problems  G6PD deficiency  heart disease  history of irregular heartbeat  if you often drink alcohol  kidney disease  liver disease  porphyria  psoriasis  an unusual or allergic reaction to chloroquine, hydroxychloroquine, other medicines, foods, dyes, or preservatives  pregnant or trying to get pregnant  breast-feeding How should I use this medicine? Take this medicine by mouth with a glass of water. Follow the directions on the prescription label. Do not cut, crush or chew  this medicine. Swallow the tablets whole. Take this medicine with food. Avoid taking antacids within 4 hours of taking this medicine. It is best to separate these medicines by at least 4 hours. Take your medicine at regular intervals. Do not take it more often than directed. Take all of your medicine as directed even if you think you are better. Do not skip doses or stop your medicine early. Talk to your pediatrician regarding the use of this medicine in children. While this drug may be prescribed for selected conditions, precautions do apply. Overdosage: If you think you have taken too much of this medicine contact a poison control center or emergency room at once. NOTE: This medicine is only for you. Do not share this medicine with others. What if I miss a dose? If you miss a dose, take it as soon as you can. If it is almost time for your next dose, take only that dose. Do not take double or extra doses. What may interact with this medicine? Do not take this medicine with any of the following medications:  cisapride  dronedarone  pimozide  thioridazine This medicine may also interact with the following medications:  ampicillin  antacids  cimetidine  cyclosporine  digoxin  kaolin  medicines for diabetes, like insulin, glipizide, glyburide  medicines for seizures like carbamazepine, phenobarbital, phenytoin  mefloquine  methotrexate  other medicines that prolong the QT interval (cause an abnormal heart rhythm)  praziquantel This list may not describe all possible interactions. Give your health care  provider a list of all the medicines, herbs, non-prescription drugs, or dietary supplements you use. Also tell them if you smoke, drink alcohol, or use illegal drugs. Some items may interact with your medicine. What should I watch for while using this medicine? Visit your health care professional for regular checks on your progress. Tell your health care professional if your  symptoms do not start to get better or if they get worse. You may need blood work done while you are taking this medicine. If you take other medicines that can affect heart rhythm, you may need more testing. Talk to your health care professional if you have questions. Your vision may be tested before and during use of this medicine. Tell your health care professional right away if you have any change in your eyesight. What side effects may I notice from receiving this medicine? Side effects that you should report to your doctor or health care professional as soon as possible:  allergic reactions like skin rash, itching or hives, swelling of the face, lips, or tongue  changes in vision  decreased hearing or ringing of the ears  muscle weakness  redness, blistering, peeling or loosening of the skin, including inside the mouth  sensitivity to light  signs and symptoms of a dangerous change in heartbeat or heart rhythm like chest pain; dizziness; fast or irregular heartbeat; palpitations; feeling faint or lightheaded, falls; breathing problems  signs and symptoms of liver injury like dark yellow or brown urine; general ill feeling or flu-like symptoms; light-colored stools; loss of appetite; nausea; right upper belly pain; unusually weak or tired; yellowing of the eyes or skin  signs and symptoms of low blood sugar such as feeling anxious; confusion; dizziness; increased hunger; unusually weak or tired; sweating; shakiness; cold; irritable; headache; blurred vision; fast heartbeat; loss of consciousness  suicidal thoughts  uncontrollable head, mouth, neck, arm, or leg movements Side effects that usually do not require medical attention (report to your doctor or health care professional if they continue or are bothersome):  diarrhea  dizziness  hair loss  headache  irritable  loss of appetite  nausea, vomiting  stomach pain This list may not describe all possible side effects.  Call your doctor for medical advice about side effects. You may report side effects to FDA at 1-800-FDA-1088. Where should I keep my medicine? Keep out of the reach of children. Store at room temperature between 15 and 30 degrees C (59 and 86 degrees F). Protect from moisture and light. Throw away any unused medicine after the expiration date. NOTE: This sheet is a summary. It may not cover all possible information. If you have questions about this medicine, talk to your doctor, pharmacist, or health care provider.  2020 Elsevier/Gold Standard (2018-11-26 12:56:32)

## 2019-06-26 LAB — COMPLETE METABOLIC PANEL WITH GFR
AG Ratio: 1.5 (calc) (ref 1.0–2.5)
ALT: 11 U/L (ref 6–29)
AST: 17 U/L (ref 10–30)
Albumin: 4.1 g/dL (ref 3.6–5.1)
Alkaline phosphatase (APISO): 38 U/L (ref 31–125)
BUN: 10 mg/dL (ref 7–25)
CO2: 25 mmol/L (ref 20–32)
Calcium: 9.2 mg/dL (ref 8.6–10.2)
Chloride: 103 mmol/L (ref 98–110)
Creat: 0.73 mg/dL (ref 0.50–1.10)
GFR, Est African American: 133 mL/min/{1.73_m2} (ref >=60)
GFR, Est Non African American: 114 mL/min/{1.73_m2} (ref >=60)
Globulin: 2.8 g/dL (calc) (ref 1.9–3.7)
Glucose, Bld: 79 mg/dL (ref 65–99)
Potassium: 3.9 mmol/L (ref 3.5–5.3)
Sodium: 136 mmol/L (ref 135–146)
Total Bilirubin: 0.6 mg/dL (ref 0.2–1.2)
Total Protein: 6.9 g/dL (ref 6.1–8.1)

## 2019-06-26 LAB — CBC WITH DIFFERENTIAL/PLATELET
Absolute Monocytes: 642 cells/uL (ref 200–950)
Basophils Absolute: 41 cells/uL (ref 0–200)
Basophils Relative: 0.6 %
Eosinophils Absolute: 138 cells/uL (ref 15–500)
Eosinophils Relative: 2 %
HCT: 35.8 % (ref 35.0–45.0)
Hemoglobin: 12 g/dL (ref 11.7–15.5)
Lymphs Abs: 1277 cells/uL (ref 850–3900)
MCH: 29.4 pg (ref 27.0–33.0)
MCHC: 33.5 g/dL (ref 32.0–36.0)
MCV: 87.7 fL (ref 80.0–100.0)
MPV: 11.1 fL (ref 7.5–12.5)
Monocytes Relative: 9.3 %
Neutro Abs: 4802 cells/uL (ref 1500–7800)
Neutrophils Relative %: 69.6 %
Platelets: 223 10*3/uL (ref 140–400)
RBC: 4.08 10*6/uL (ref 3.80–5.10)
RDW: 11.2 % (ref 11.0–15.0)
Total Lymphocyte: 18.5 %
WBC: 6.9 10*3/uL (ref 3.8–10.8)

## 2019-06-26 NOTE — Telephone Encounter (Signed)
Lab results received, CBC and CMP WNL. Advised patient to mail consent form back asap for PLQ prescription. Patient verbalized understanding.

## 2019-07-10 NOTE — Telephone Encounter (Signed)
Spoke with patient and patient will drop consent form off this week.

## 2019-07-16 MED ORDER — HYDROXYCHLOROQUINE SULFATE 200 MG PO TABS: ORAL_TABLET | ORAL | 0 refills | Status: DC

## 2019-07-16 NOTE — Addendum Note (Signed)
Addended by: Earnestine Mealing on: 07/16/2019 02:56 PM   Modules accepted: Orders

## 2019-07-16 NOTE — Telephone Encounter (Addendum)
Plaquenil consent form received.   Labs: 06/25/2019 CBC and CMP WNL.   Discussed dose with patient and advised patient to update eye exam. Advised patient labs are due 1 month after starting PLQ, she verbalized understanding. Prescription sent to the pharmacy.  Patient questioned if she could start PLQ while still on prednisone taper and per Hazel Sams, PA-C, she should go ahead and start taking PLQ while finishing the prednisone taper.  I advised patient and she verbalized understanding.

## 2019-07-24 ENCOUNTER — Ambulatory Visit: Payer: Self-pay | Admitting: Rheumatology

## 2019-08-23 NOTE — Progress Notes (Deleted)
Virtual Visit via Telephone Note  I connected with Leah Wise on 08/23/19 at 10:45 AM EST by telephone and verified that I am speaking with the correct person using two identifiers.  Location: Patient: Home Provider: Clinic  This service was conducted via virtual visit.   The patient was located at home. I was located in my office.  Consent was obtained prior to the virtual visit and is aware of possible charges through their insurance for this visit.  The patient is an established patient.  Dr. Estanislado Pandy, MD conducted the virtual visit and Hazel Sams, PA-C acted as scribe during the service.  Office staff helped with scheduling follow up visits after the service was conducted.     I discussed the limitations, risks, security and privacy concerns of performing an evaluation and management service by telephone and the availability of in person appointments. I also discussed with the patient that there may be a patient responsible charge related to this service. The patient expressed understanding and agreed to proceed.  CC: History of Present Illness: Patient is a 24 year old female with a past medical history of seropositive rheumatoid arthritis and Raynaud's.  Review of Systems  Constitutional: Negative for fever and malaise/fatigue.  Eyes: Negative for photophobia, pain, discharge and redness.  Respiratory: Negative for cough, shortness of breath and wheezing.   Cardiovascular: Negative for chest pain and palpitations.  Gastrointestinal: Negative for blood in stool, constipation and diarrhea.  Genitourinary: Negative for dysuria.  Musculoskeletal: Negative for back pain, joint pain, myalgias and neck pain.  Skin: Negative for rash.  Neurological: Negative for dizziness and headaches.  Psychiatric/Behavioral: Negative for depression. The patient is not nervous/anxious and does not have insomnia.      Observations/Objective:  Physical Exam  Constitutional: She is oriented to  person, place, and time.  Neurological: She is alert and oriented to person, place, and time.  Psychiatric: Mood, memory, affect and judgment normal.     Patient reports morning stiffness for *** {minute/hour:19697}.   Patient {Actions; denies-reports:120008} nocturnal pain.  Difficulty dressing/grooming: {ACTIONS;DENIES/REPORTS:21021675::"Denies"} Difficulty climbing stairs: {ACTIONS;DENIES/REPORTS:21021675::"Denies"} Difficulty getting out of chair: {ACTIONS;DENIES/REPORTS:21021675::"Denies"} Difficulty using hands for taps, buttons, cutlery, and/or writing: {ACTIONS;DENIES/REPORTS:21021675::"Denies"}  Assessment and Plan:  Visit Diagnoses:Rheumatoid arthritis involving multiple sites with positive rheumatoid factor ([place])- Positive RF, positive anti-CCP, positive ANA, positive synovitis initially):   High risk medication use - PLQ 200 mg by mouth twice a day Monday through Friday.    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  Follow Up Instructions: She will follow up in    I discussed the assessment and treatment plan with the patient. The patient was provided an opportunity to ask questions and all were answered. The patient agreed with the plan and demonstrated an understanding of the instructions.   The patient was advised to call back or seek an in-person evaluation if the symptoms worsen or if the condition fails to improve as anticipated.  I provided *** minutes of non-face-to-face time during this encounter.

## 2019-08-26 ENCOUNTER — Telehealth: Payer: Self-pay | Admitting: Rheumatology

## 2019-08-26 DIAGNOSIS — M0579 Rheumatoid arthritis with rheumatoid factor of multiple sites without organ or systems involvement: Secondary | ICD-10-CM

## 2019-08-26 MED ORDER — HYDROXYCHLOROQUINE SULFATE 200 MG PO TABS: ORAL_TABLET | ORAL | 0 refills | Status: AC

## 2019-08-26 NOTE — Telephone Encounter (Signed)
Patient called requesting prescription refill of Plaquenil to be sent to [facility] at [place].

## 2019-08-26 NOTE — Telephone Encounter (Signed)
Last Visit: 06/24/19 Next Visit: 08/27/19 Labs: 06/25/19 WNL NO base line eye exam on file.   Patient advised she will need to schedule her PLQ eye exam and she is due to update labs. Patient will come 08/27/19 to update.   Okay to refill 30 day supply per Dr. Estanislado Pandy

## 2019-08-27 ENCOUNTER — Telehealth: Payer: Self-pay | Admitting: Rheumatology

## 2019-08-27 ENCOUNTER — Other Ambulatory Visit: Payer: Self-pay

## 2019-09-20 ENCOUNTER — Telehealth: Payer: Self-pay | Admitting: Rheumatology

## 2019-09-20 DIAGNOSIS — I7300 Raynaud's syndrome without gangrene: Secondary | ICD-10-CM

## 2019-09-20 DIAGNOSIS — M0579 Rheumatoid arthritis with rheumatoid factor of multiple sites without organ or systems involvement: Secondary | ICD-10-CM

## 2019-09-20 NOTE — Telephone Encounter (Signed)
Referral placed.

## 2019-09-20 NOTE — Telephone Encounter (Signed)
Patient called stating her insurance has changed to [facility] and Dr. Estanislado Pandy is not in network.  Patient is requesting a referral to Dr. Arlice Colt in [place] or if there is someone else that Dr. Estanislado Pandy would recommend.  Please advise.
